# Patient Record
Sex: Male | Born: 1990 | Race: Black or African American | Hispanic: No | Marital: Single | Smoking: Current every day smoker
Health system: Southern US, Community
[De-identification: ages and names within clinical notes are randomized; demographics above are authoritative.]

## PROBLEM LIST (undated history)

## (undated) DIAGNOSIS — I10 Essential (primary) hypertension: Secondary | ICD-10-CM

## (undated) HISTORY — PX: ANTERIOR CRUCIATE LIGAMENT REPAIR: SHX115

---
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
## 2008-07-03 ENCOUNTER — Emergency Department (HOSPITAL_COMMUNITY): Admission: EM | Admit: 2008-07-03 | Discharge: 2008-07-03 | Payer: Self-pay | Admitting: Emergency Medicine

## 2008-08-07 ENCOUNTER — Ambulatory Visit (HOSPITAL_COMMUNITY): Admission: RE | Admit: 2008-08-07 | Discharge: 2008-08-07 | Payer: Self-pay | Admitting: Family Medicine

## 2008-08-10 ENCOUNTER — Ambulatory Visit: Payer: Self-pay | Admitting: Orthopedic Surgery

## 2008-08-10 DIAGNOSIS — M23302 Other meniscus derangements, unspecified lateral meniscus, unspecified knee: Secondary | ICD-10-CM

## 2008-08-10 DIAGNOSIS — S83509A Sprain of unspecified cruciate ligament of unspecified knee, initial encounter: Secondary | ICD-10-CM

## 2008-08-12 ENCOUNTER — Encounter: Payer: Self-pay | Admitting: Orthopedic Surgery

## 2008-08-12 ENCOUNTER — Telehealth: Payer: Self-pay | Admitting: Orthopedic Surgery

## 2008-08-18 ENCOUNTER — Encounter: Payer: Self-pay | Admitting: Orthopedic Surgery

## 2008-08-21 ENCOUNTER — Ambulatory Visit: Payer: Self-pay | Admitting: Orthopedic Surgery

## 2008-08-21 ENCOUNTER — Observation Stay (HOSPITAL_COMMUNITY): Admission: RE | Admit: 2008-08-21 | Discharge: 2008-08-22 | Payer: Self-pay | Admitting: Orthopedic Surgery

## 2008-08-22 ENCOUNTER — Encounter: Payer: Self-pay | Admitting: Orthopedic Surgery

## 2008-08-25 ENCOUNTER — Ambulatory Visit: Payer: Self-pay | Admitting: Orthopedic Surgery

## 2008-08-27 ENCOUNTER — Encounter (HOSPITAL_COMMUNITY): Admission: RE | Admit: 2008-08-27 | Discharge: 2008-09-26 | Payer: Self-pay | Admitting: Orthopedic Surgery

## 2008-08-27 ENCOUNTER — Encounter: Payer: Self-pay | Admitting: Orthopedic Surgery

## 2008-09-01 ENCOUNTER — Ambulatory Visit: Payer: Self-pay | Admitting: Orthopedic Surgery

## 2008-09-08 ENCOUNTER — Encounter: Payer: Self-pay | Admitting: Orthopedic Surgery

## 2008-09-25 ENCOUNTER — Encounter: Payer: Self-pay | Admitting: Orthopedic Surgery

## 2008-09-28 ENCOUNTER — Encounter (HOSPITAL_COMMUNITY): Admission: RE | Admit: 2008-09-28 | Discharge: 2008-10-28 | Payer: Self-pay | Admitting: Orthopedic Surgery

## 2008-09-30 ENCOUNTER — Ambulatory Visit: Payer: Self-pay | Admitting: Orthopedic Surgery

## 2008-10-14 ENCOUNTER — Encounter: Payer: Self-pay | Admitting: Orthopedic Surgery

## 2008-10-30 ENCOUNTER — Encounter (HOSPITAL_COMMUNITY): Admission: RE | Admit: 2008-10-30 | Discharge: 2008-11-29 | Payer: Self-pay | Admitting: Orthopedic Surgery

## 2008-11-11 ENCOUNTER — Ambulatory Visit: Payer: Self-pay | Admitting: Orthopedic Surgery

## 2008-11-25 ENCOUNTER — Encounter: Payer: Self-pay | Admitting: Orthopedic Surgery

## 2008-12-10 ENCOUNTER — Encounter: Payer: Self-pay | Admitting: Orthopedic Surgery

## 2011-04-11 NOTE — Op Note (Signed)
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  He came down wrong on his knee and felt a loud  pop, he had immediate swelling, and negative x-rays.  Clinical exam  revealed a possible ACL tear.  This was confirmed by MRI at [facility] on August 07, 2008.  He was seen in the office on August 10, 2008, was recommended that he have surgery to repair his torn ACL,  and informed consent was given in the office.   It was also noted that he had a medial meniscal tear on MRI.  Collateral  ligaments were said to be damaged on the MRI, but this was not found to  be clinically or under anesthesia today.   PREOPERATIVE DIAGNOSES:  Anterior cruciate ligament tear and medial  meniscal tear of the left knee.   POSTOPERATIVE DIAGNOSES:  Anterior cruciate ligament tear, left knee;  medial meniscal tear, left knee; and lateral meniscal tear, left knee.   SURGEON:  Vickki Hearing, MD   PROCEDURES:  1. Anterior cruciate ligament reconstruction with Achilles tendon      allograft.  2. Inside-out medial meniscal repair.  3. Partial lateral meniscectomy.   ASSISTANT:  Assisted by Margaree Mackintosh.   ANESTHESIA:  General.   FINDINGS:  Under anesthesia, he had a 2 to 3+ Lachman with a 0.2+ pivot  shift.  Collateral ligaments were stable at 0 and 30 degrees, and there  was no external rotation abnormality compared right to left.  This was  tested at 90 and 30 degrees.   PCL was intact.   The patient was marked on the left knee after proper identification,  countersigned by the  surgeon.  The patient was given antibiotics, taken  to surgery, had general anesthesia followed by exam under anesthesia.  Time-out was completed.   Leg was previously prepped with DuraPrep and draped sterilely.  We  injected the knee with 20 mL of Marcaine with epinephrine.  We made a  lateral portal.  We did a diagnostic arthroscopy.  ACL was found to be  completely shredded.  Medial meniscal had a peripheral rim tear.  Lateral meniscus had a white-white posterior horn tear.   The ACL was debrided.  The over-the-top position was identified.  A  small notchplasty was performed.   Tourniquet was elevated and posteromedial incision was made.  Subcutaneous tissue was divided.  Medial hamstrings were identified,  retracted posteriorly down, and dissection was carried down to the  capsule.  A speculum was placed around the posterolateral corner and  then a inside-out meniscal repair with 1 vertical mattress suture and 1  horizontal mattress suture.   The ACL  reconstruction was then completed.  We drilled a 11-mm tunnel on  the tibia in the ACL posterior footprint.  We then cleaned the tunnel,  rasped it posteriorly.  We inserted a 7-mm over-the-top guide at the 2  o'clock position.  We drilled the beef pin out the anterolateral thigh  and over reamed this with an 11-mm reamer.   The graft had been falling on the back table for appropriate time.  We  then fashioned the graft with 11-mm tunnel with the Achilles tendon.  We  over drilled the beef pin with a Endobutton cannulated drill.  We  measured our tunnel to be 50.  We prepared an Endobutton for 50-mm  tunnel length construct with a 35-mm bone plug tunnel to allow 10 mm of  turning radius.   The knee was irrigated and debrided.  Soft tissue was resected as  needed.  Graft was then passed.  The slit maneuver was performed.  The  graft construct was confirmed to be stable.  Knee was taken through a  range of motion.  Extension was  checked and it was full with no  impingement.   The knee was then placed in extension and a 11 x 35 screw was placed on  the tibial side with the knee in extension.  This was snug fit.  We did  rotate the graft laterally to increase strength of the graft.  Endobutton was a continuous loop of 35-mm button.  We used a [employer]  Bioabsorbable ACL Interference screw PLLA.   We placed the scope back in the knee and tested the ligament for  tension.  It was excellent.  Range of motion was full.  We then tied our  sutures on the posteromedial capsule, then probed the meniscus to  confirm repair was solid.   We then irrigated the knee, and closed the posteromedial incision with 0  Monocryl and staples.  We placed a pain pump catheter in this incision.   We closed the medial tunnel site with 0 Monocryl and staples as well.  The knee was then dressed sterilely.  Cryo/Cuff and Ace bandage placed  in a locked hinge brace.  The patient was extubated and taken to  recovery room in stable condition.   POSTOPERATIVE PLAN:  Because of the meniscal repair, the patient's rehab  protocol will be altered.  He will be 0-90 degrees for 6 weeks.  He can  weightbear in full extension in a brace for 6 weeks.      Vickki Hearing, M.D.  Electronically Signed     [facility]/MEDQ  D:  08/21/2008  T:  08/21/2008  Job:  [id number]

## 2011-04-11 NOTE — Consult Note (Signed)
NAME:  Thomas Heath, Thomas Heath               ACCOUNT NO.:  [id number]   MEDICAL RECORD NO.:  [id number]          PATIENT TYPE:  INP   LOCATION:  IC05                          FACILITY:  [facility]   PHYSICIAN:  Francoise Schaumann. Halm, DO, FAAPDATE OF BIRTH:  01-Apr-1991   DATE OF CONSULTATION:  08/22/2008  DATE OF DISCHARGE:  08/22/2008                                 CONSULTATION   PHYSICIAN REQUESTING CONSULTATION:  Vickki Hearing, MD   REASON FOR CONSULTATION:  Postoperative hypertension.   BRIEF HISTORY:  The patient is a regular patient of Dr. Gerda Diss, whom I  am covering for this weekend when I received the consultation.  He is an  20 year old black male that had surgery for a left ACL tear and medial  meniscus repair.  Postoperatively, the patient was noted to be  hypertensive in recovery and was admitted to the hospital and placed in  the ICU as a backup measure to monitor his heart rate and blood  pressure.  Postoperatively, his blood pressure was approximately  160/100.  He had no significant tachycardia and was otherwise stable.  I  suggested to Dr. Romeo Apple that without a history of asthma or  bradycardia that he initiate metoprolol 25 mg p.o. b.i.d. on the night  before my evaluation.   Overnight, the patient has done fine, but has had some mild persistent  hypertension in the 140-150 range systolic and 90-100 diastolic.  His  heart rate has been up into the 80s and 90s.  I have noticed he has had  some moderate pain and he is provided with appropriate analgesics.   PHYSICAL EXAMINATION:  GENERAL:  A well-nourished, well-developed,  muscular male in no distress.  He was cooperative, alert, and oriented.  NECK:  Thyroid was normal to palpation.  He had no JVD.  HEART:  Regular with no S4.  No ectopy.  LUNGS:  Clear in all fields.  ABDOMEN:  Soft and nontender.  EXTREMITIES:  He had his left leg and knee wrapped.  He had no obvious  edema noted of the right lower extremity.   IMPRESSION:  1. New-onset hypertension postoperatively.  2. Otherwise, stable medical status.   RECOMMENDATIONS:  I have suggested to Dr. Romeo Apple that the patient to  be continued on metoprolol for now at an increased dose since he  tolerated the 25 mg without any bradycardia or extreme response of his  blood pressure dropping.  I suggested that he gets 50 mg p.o. twice a  day, which I have ordered the day I saw him and that he continue this as  an outpatient until seen by Dr. Gerda Diss, his regular physician, within  the next week.      Francoise Schaumann. Milford Cage, DO, FAAP  Electronically Signed     [facility]/MEDQ  D:  08/24/2008  T:  08/25/2008  Job:  [id number]   cc:   Vickki Hearing, M.D.  Fax: [phone]   Donna Bernard, M.D.  Fax: [phone]

## 2011-08-28 LAB — BASIC METABOLIC PANEL
BUN: 12
CO2: 28
Calcium: 10
Chloride: 104
Creatinine, Ser: 1
GFR calc non Af Amer: >60
Glucose, Bld: 76
Potassium: 4.1
Sodium: 137

## 2011-08-28 LAB — HEMOGLOBIN AND HEMATOCRIT, BLOOD: Hemoglobin: 14.2

## 2012-01-07 ENCOUNTER — Emergency Department (HOSPITAL_COMMUNITY)
Admission: EM | Admit: 2012-01-07 | Discharge: 2012-01-07 | Disposition: A | Discharge status: Home / Self Care | Payer: BC Managed Care – PPO | Attending: Emergency Medicine | Admitting: Emergency Medicine

## 2012-01-07 ENCOUNTER — Encounter (HOSPITAL_COMMUNITY): Payer: Self-pay

## 2012-01-07 DIAGNOSIS — R059 Cough, unspecified: Secondary | ICD-10-CM | POA: Insufficient documentation

## 2012-01-07 DIAGNOSIS — R070 Pain in throat: Secondary | ICD-10-CM | POA: Insufficient documentation

## 2012-01-07 DIAGNOSIS — R6889 Other general symptoms and signs: Secondary | ICD-10-CM | POA: Insufficient documentation

## 2012-01-07 DIAGNOSIS — J45909 Unspecified asthma, uncomplicated: Secondary | ICD-10-CM | POA: Insufficient documentation

## 2012-01-07 DIAGNOSIS — R05 Cough: Secondary | ICD-10-CM

## 2012-01-07 DIAGNOSIS — R6883 Chills (without fever): Secondary | ICD-10-CM | POA: Insufficient documentation

## 2012-01-07 DIAGNOSIS — H9209 Otalgia, unspecified ear: Secondary | ICD-10-CM | POA: Insufficient documentation

## 2012-01-07 MED ORDER — GUAIFENESIN-CODEINE 100-10 MG/5ML PO SOLN
10.0000 mL | Freq: Once | ORAL | Status: AC
  Administered 2012-01-07: 10 mL via ORAL
  Filled 2012-01-07: qty 10

## 2012-01-07 MED ORDER — GUAIFENESIN-CODEINE 100-10 MG/5ML PO SYRP: 10.0000 mL | ORAL_SOLUTION | Freq: Three times a day (TID) | ORAL | Status: AC | PRN

## 2012-01-07 MED ORDER — ALBUTEROL SULFATE HFA 108 (90 BASE) MCG/ACT IN AERS
2.0000 | INHALATION_SPRAY | Freq: Once | RESPIRATORY_TRACT | Status: AC
  Administered 2012-01-07: 2 via RESPIRATORY_TRACT
  Filled 2012-01-07: qty 6.7

## 2012-01-07 MED ORDER — AZITHROMYCIN 250 MG PO TABS: ORAL_TABLET | ORAL | Status: DC

## 2012-01-07 NOTE — ED Notes (Signed)
Pt showed proper demonstration of inhaler as  Pt has a history of asthma. And prior use of said inhalers.

## 2012-01-07 NOTE — ED Provider Notes (Signed)
Medical screening examination/treatment/procedure(s) were performed by non-physician practitioner and as supervising physician I was immediately available for consultation/collaboration.   Loren Racer, MD 01/07/12 [phone]

## 2012-01-07 NOTE — ED Notes (Signed)
Pt presents with cough x 3 days. 

## 2012-01-07 NOTE — ED Provider Notes (Signed)
History     CSN: [id number]  Arrival date & time 01/07/12  2126   First MD Initiated Contact with Patient 01/07/12 2157      Chief Complaint  Patient presents with  . Cough    (Consider location/radiation/quality/duration/timing/severity/associated sxs/prior treatment) HPI Comments: Patient complains of productive cough for 3 days. He also reports some chills, sore throat and left ear pain.  He states he has a history of asthma and has been without his albuterol inhaler for several days.  He denies fever at home, vomiting, shortness of breath, chest pain or abdominal pain.    Patient is a 21 y.o. male presenting with cough. The history is provided by the patient. No language interpreter was used.  Cough This is a new problem. The current episode started more than 2 days ago. The problem occurs every few minutes. The problem has not changed since onset.The cough is productive of sputum. There has been no fever. Associated symptoms include chills, ear pain and sore throat. Pertinent negatives include no chest pain, no ear congestion, no headaches, no rhinorrhea, no myalgias, no shortness of breath and no wheezing. He has tried nothing for the symptoms. The treatment provided no relief. He is not a smoker. His past medical history is significant for asthma. His past medical history does not include pneumonia or COPD.    Past Medical History  Diagnosis Date  . Asthma     Past Surgical History  Procedure Date  . Anterior cruciate ligament repair     History reviewed. No pertinent family history.  History  Substance Use Topics  . Smoking status: Never Smoker   . Smokeless tobacco: Not on file  . Alcohol Use: No      Review of Systems  Constitutional: Positive for chills. Negative for fever, activity change and appetite change.  HENT: Positive for ear pain, sore throat and sneezing. Negative for facial swelling, rhinorrhea, trouble swallowing, neck pain and neck stiffness.     Respiratory: Positive for cough. Negative for chest tightness, shortness of breath and wheezing.   Cardiovascular: Negative for chest pain.  Gastrointestinal: Negative for nausea, vomiting, abdominal pain and diarrhea.  Musculoskeletal: Negative for myalgias and arthralgias.  Skin: Negative.   Neurological: Negative for dizziness and headaches.  Hematological: Negative for adenopathy. Does not bruise/bleed easily.  All other systems reviewed and are negative.    Allergies  Review of patient's allergies indicates no known allergies.  Home Medications  No current outpatient prescriptions on file.  BP 161/87  Pulse 90  Temp(Src) 98.2 F (36.8 C) (Oral)  Resp 22  Ht 5\' 6"  (1.676 m)  Wt 180 lb (81.647 kg)  BMI 29.05 kg/m2  SpO2 100%  Physical Exam  Nursing note and vitals reviewed. Constitutional: He is oriented to person, place, and time. He appears well-developed and well-nourished. No distress.  HENT:  Head: Normocephalic and atraumatic.  Right Ear: Tympanic membrane and ear canal normal. No mastoid tenderness. No hemotympanum.  Left Ear: Ear canal normal. No mastoid tenderness. Tympanic membrane is erythematous. No hemotympanum.  Mouth/Throat: Uvula is midline, oropharynx is clear and moist and mucous membranes are normal.  Neck: Normal range of motion. Neck supple. No thyromegaly present.  Cardiovascular: Normal rate, regular rhythm, normal heart sounds and intact distal pulses.   No murmur heard. Pulmonary/Chest: Effort normal. No respiratory distress. He has no wheezes. He has no rales.       Lungs sounds are coarse bilaterally  Musculoskeletal: He exhibits no edema.  Lymphadenopathy:    He has no cervical adenopathy.  Neurological: He is alert and oriented to person, place, and time. He exhibits normal muscle tone. Coordination normal.  Skin: Skin is warm and dry.    ED Course  Procedures (including critical care time)       MDM    Patient is alert, no  acute distress. Mucous membranes are moist. Vitals are stable, no tachycardia, tachypnea, or hypoxia. Lung sounds are coarse without wheezes or rales.    Patient / Family / Caregiver understand and agree with initial ED impression and plan with expectations set for ED visit. Pt stable in ED with no significant deterioration in condition.Pt feels improved after observation and/or treatment in ED.       Maxwell Lemen L. [place], [place] 01/07/12 2217

## 2012-01-07 NOTE — ED Notes (Signed)
Pt presents to ED secondary to cough, chest pain related to cough, sore throat and left ear pain.  Pt states his left knee also hurts, but is an old injury. Pt also reports chills/feverish feeling.

## 2012-07-09 ENCOUNTER — Encounter (HOSPITAL_COMMUNITY): Payer: Self-pay

## 2012-07-09 ENCOUNTER — Emergency Department (HOSPITAL_COMMUNITY)
Admission: EM | Admit: 2012-07-09 | Discharge: 2012-07-09 | Disposition: A | Discharge status: Home / Self Care | Payer: BC Managed Care – PPO | Attending: Emergency Medicine | Admitting: Emergency Medicine

## 2012-07-09 DIAGNOSIS — J45909 Unspecified asthma, uncomplicated: Secondary | ICD-10-CM | POA: Insufficient documentation

## 2012-07-09 DIAGNOSIS — K5289 Other specified noninfective gastroenteritis and colitis: Secondary | ICD-10-CM | POA: Insufficient documentation

## 2012-07-09 DIAGNOSIS — K529 Noninfective gastroenteritis and colitis, unspecified: Secondary | ICD-10-CM

## 2012-07-09 LAB — CBC WITH DIFFERENTIAL/PLATELET
Eosinophils Absolute: 0.4 10*3/uL (ref 0.0–0.7)
Eosinophils Relative: 5 % (ref 0–5)
Hemoglobin: 13.3 g/dL (ref 13.0–17.0)
Lymphocytes Relative: 42 % (ref 12–46)
Lymphs Abs: 3.3 10*3/uL (ref 0.7–4.0)
MCH: 31 pg (ref 26.0–34.0)
MCV: 91.6 fL (ref 78.0–100.0)
Monocytes Relative: 6 % (ref 3–12)
Platelets: 279 10*3/uL (ref 150–400)
RBC: 4.29 MIL/uL (ref 4.22–5.81)
WBC: 7.8 10*3/uL (ref 4.0–10.5)

## 2012-07-09 LAB — BASIC METABOLIC PANEL
BUN: 18 mg/dL (ref 6–23)
CO2: 24 mEq/L (ref 19–32)
Calcium: 9.7 mg/dL (ref 8.4–10.5)
Glucose, Bld: 82 mg/dL (ref 70–99)
Potassium: 3.8 mEq/L (ref 3.5–5.1)
Sodium: 140 mEq/L (ref 135–145)

## 2012-07-09 MED ORDER — SODIUM CHLORIDE 0.9 % IV BOLUS (SEPSIS): 1000.0000 mL | Freq: Once | INTRAVENOUS | Status: DC

## 2012-07-09 MED ORDER — PANTOPRAZOLE SODIUM 20 MG PO TBEC: 20.0000 mg | DELAYED_RELEASE_TABLET | Freq: Every day | ORAL | Status: DC

## 2012-07-09 MED ORDER — ONDANSETRON HCL 4 MG/2ML IJ SOLN
4.0000 mg | Freq: Once | INTRAMUSCULAR | Status: AC
Start: 2012-07-09 — End: 2012-07-09
  Administered 2012-07-09: 4 mg via INTRAVENOUS
  Filled 2012-07-09: qty 2

## 2012-07-09 MED ORDER — SODIUM CHLORIDE 0.9 % IV BOLUS (SEPSIS)
1000.0000 mL | Freq: Once | INTRAVENOUS | Status: AC
  Administered 2012-07-09: 1000 mL via INTRAVENOUS

## 2012-07-09 NOTE — ED Provider Notes (Cosign Needed)
History     CSN: [id number]  Arrival date & time 07/09/12  1221   First MD Initiated Contact with Patient 07/09/12 1300      Chief Complaint  Patient presents with  . Emesis  . Diarrhea  . Abdominal Pain    (Consider location/radiation/quality/duration/timing/severity/associated sxs/prior treatment) Patient is a 21 y.o. male presenting with vomiting, diarrhea, and abdominal pain. The history is provided by the patient (the pt complains of vomiting and diarhea since sat). No language interpreter was used.  Emesis  This is a new problem. The current episode started more than 2 days ago. The problem occurs 5 to 10 times per day. The problem has not changed since onset.The emesis has an appearance of stomach contents. The fever has been present for 1 to 2 days. Associated symptoms include abdominal pain and diarrhea. Pertinent negatives include no cough and no headaches. Risk factors: nothing.  Diarrhea The primary symptoms include abdominal pain, vomiting and diarrhea. Primary symptoms do not include fatigue or rash.  The illness does not include back pain.  Abdominal Pain The primary symptoms of the illness include abdominal pain, vomiting and diarrhea. The primary symptoms of the illness do not include fatigue.  Symptoms associated with the illness do not include hematuria, frequency or back pain.    Past Medical History  Diagnosis Date  . Asthma     Past Surgical History  Procedure Date  . Anterior cruciate ligament repair     No family history on file.  History  Substance Use Topics  . Smoking status: Never Smoker   . Smokeless tobacco: Not on file  . Alcohol Use: No      Review of Systems  Constitutional: Negative for fatigue.  HENT: Negative for congestion, sinus pressure and ear discharge.   Eyes: Negative for discharge.  Respiratory: Negative for cough.   Cardiovascular: Negative for chest pain.  Gastrointestinal: Positive for vomiting, abdominal pain and  diarrhea.  Genitourinary: Negative for frequency and hematuria.  Musculoskeletal: Negative for back pain.  Skin: Negative for rash.  Neurological: Negative for seizures and headaches.  Hematological: Negative.   Psychiatric/Behavioral: Negative for hallucinations.    Allergies  Review of patient's allergies indicates no known allergies.  Home Medications   Current Outpatient Rx  Name Route Sig Dispense Refill  . DIPHENHYDRAMINE HCL 25 MG PO TABS Oral Take 25 mg by mouth daily as needed. Allergies    . PANTOPRAZOLE SODIUM 20 MG PO TBEC Oral Take 1 tablet (20 mg total) by mouth daily. 20 tablet 1    BP 124/65  Pulse 57  Temp 98.3 F (36.8 C) (Oral)  Resp 20  Ht 5\' 6"  (1.676 m)  Wt 180 lb (81.647 kg)  BMI 29.05 kg/m2  SpO2 98%  Physical Exam  Constitutional: He is oriented to person, place, and time. He appears well-developed.  HENT:  Head: Normocephalic and atraumatic.  Eyes: Conjunctivae and EOM are normal. No scleral icterus.  Neck: Neck supple. No thyromegaly present.  Cardiovascular: Normal rate and regular rhythm.  Exam reveals no gallop and no friction rub.   No murmur heard. Pulmonary/Chest: No stridor. He has no wheezes. He has no rales. He exhibits no tenderness.  Abdominal: He exhibits no distension. There is tenderness. There is no rebound.       Mild epigastric tender  Musculoskeletal: Normal range of motion. He exhibits no edema.  Lymphadenopathy:    He has no cervical adenopathy.  Neurological: He is oriented to person, place, and  time. Coordination normal.  Skin: No rash noted. No erythema.  Psychiatric: He has a normal mood and affect. His behavior is normal.    ED Course  Procedures (including critical care time)  Labs Reviewed  BASIC METABOLIC PANEL - Abnormal; Notable for the following:    Creatinine, Ser 1.36 (*)     GFR calc non Af Amer 73 (*)     GFR calc Af Amer 84 (*)     All other components within normal limits  CBC WITH DIFFERENTIAL     No results found.   1. Gastroenteritis       MDM          Benny Lennert, MD 07/09/12 1515

## 2012-07-09 NOTE — ED Notes (Signed)
Pt states N/V/D started Saturday, vomiting stopped yesterday however continues to have diarrhea with stomach cramps/spasm. Pt denies fever.

## 2012-07-09 NOTE — ED Notes (Signed)
Pt c/o n/v/d and abd pain since Saturday.

## 2012-11-22 ENCOUNTER — Emergency Department (HOSPITAL_COMMUNITY)
Admission: EM | Admit: 2012-11-22 | Discharge: 2012-11-22 | Discharge status: Against Medical Advice | Payer: BC Managed Care – PPO | Attending: Emergency Medicine | Admitting: Emergency Medicine

## 2012-11-22 ENCOUNTER — Encounter (HOSPITAL_COMMUNITY): Payer: Self-pay | Admitting: *Deleted

## 2012-11-22 DIAGNOSIS — R197 Diarrhea, unspecified: Secondary | ICD-10-CM | POA: Insufficient documentation

## 2012-11-22 DIAGNOSIS — R1110 Vomiting, unspecified: Secondary | ICD-10-CM | POA: Insufficient documentation

## 2012-11-22 DIAGNOSIS — R109 Unspecified abdominal pain: Secondary | ICD-10-CM | POA: Insufficient documentation

## 2012-11-22 DIAGNOSIS — J45909 Unspecified asthma, uncomplicated: Secondary | ICD-10-CM | POA: Insufficient documentation

## 2012-11-22 LAB — CBC WITH DIFFERENTIAL/PLATELET
Basophils Relative: 1 % (ref 0–1)
Eosinophils Absolute: 0.1 10*3/uL (ref 0.0–0.7)
Eosinophils Relative: 2 % (ref 0–5)
Hemoglobin: 14.6 g/dL (ref 13.0–17.0)
Lymphs Abs: 3.1 10*3/uL (ref 0.7–4.0)
MCH: 31.7 pg (ref 26.0–34.0)
MCHC: 35.3 g/dL (ref 30.0–36.0)
MCV: 89.8 fL (ref 78.0–100.0)
Monocytes Relative: 7 % (ref 3–12)
RBC: 4.61 MIL/uL (ref 4.22–5.81)

## 2012-11-22 LAB — COMPREHENSIVE METABOLIC PANEL
BUN: 12 mg/dL (ref 6–23)
Calcium: 9.3 mg/dL (ref 8.4–10.5)
Creatinine, Ser: 1.1 mg/dL (ref 0.50–1.35)
GFR calc Af Amer: >90 mL/min (ref >90)
Glucose, Bld: 94 mg/dL (ref 70–99)
Total Protein: 7.9 g/dL (ref 6.0–8.3)

## 2012-11-22 NOTE — ED Notes (Signed)
Pt not in waiting room

## 2012-11-22 NOTE — ED Notes (Signed)
Pt states vomiting, diarrhea, and abdominal pain x 3 days. Unable to keep food down, able to keep liquids down. Last vomited at 0600. NAD.

## 2012-11-22 NOTE — ED Notes (Signed)
Unable to locate pt in waiting areas 

## 2014-01-07 ENCOUNTER — Encounter: Payer: Self-pay | Admitting: Family Medicine

## 2014-01-07 ENCOUNTER — Ambulatory Visit (INDEPENDENT_AMBULATORY_CARE_PROVIDER_SITE_OTHER): Payer: BC Managed Care – PPO | Admitting: Family Medicine

## 2014-01-07 VITALS — BP 130/80 | Ht 67.0 in | Wt 206.2 lb

## 2014-01-07 DIAGNOSIS — Z0000 Encounter for general adult medical examination without abnormal findings: Secondary | ICD-10-CM

## 2014-01-07 MED ORDER — ALBUTEROL SULFATE HFA 108 (90 BASE) MCG/ACT IN AERS: 2.0000 | INHALATION_SPRAY | Freq: Four times a day (QID) | RESPIRATORY_TRACT | Status: DC | PRN

## 2014-01-07 NOTE — Progress Notes (Signed)
   Subjective:    Patient ID: Thomas Heath, male    DOB: 03/31/1991, 23 y.o.   MRN: [id number]  HPI Patient arrives for an annual physical.   Aches every now and then in the knee, history of meniscal injury in the knee.  No hx of blood work  B ball in the past, Patient would also like a tick bite on his back checked. No fever no rash elsewhere no headache no myalgias.  Not exercising  Smokes several cigarettes per day.  Use to exercise regularly.  Review of Systems  Constitutional: Negative for fever, activity change and appetite change.  HENT: Negative for congestion and rhinorrhea.   Eyes: Negative for discharge.  Respiratory: Negative for cough and wheezing.   Cardiovascular: Negative for chest pain.  Gastrointestinal: Negative for vomiting, abdominal pain and blood in stool.  Genitourinary: Negative for frequency and difficulty urinating.  Musculoskeletal: Negative for neck pain.  Skin: Negative for rash.  Allergic/Immunologic: Negative for environmental allergies and food allergies.  Neurological: Negative for weakness and headaches.  Psychiatric/Behavioral: Negative for agitation.  All other systems reviewed and are negative.       Objective:   Physical Exam  Vitals reviewed. Constitutional: He appears well-developed and well-nourished.  HENT:  Head: Normocephalic and atraumatic.  Right Ear: External ear normal.  Left Ear: External ear normal.  Nose: Nose normal.  Mouth/Throat: Oropharynx is clear and moist.  Eyes: EOM are normal. Pupils are equal, round, and reactive to light.  Neck: Normal range of motion. Neck supple. No thyromegaly present.  Cardiovascular: Normal rate, regular rhythm and normal heart sounds.   No murmur heard. Pulmonary/Chest: Effort normal and breath sounds normal. No respiratory distress. He has no wheezes.  Abdominal: Soft. Bowel sounds are normal. He exhibits no distension and no mass. There is no tenderness.  Genitourinary:  Penis normal.  Musculoskeletal: Normal range of motion. He exhibits no edema.  Lymphadenopathy:    He has no cervical adenopathy.  Neurological: He is alert. He exhibits normal muscle tone.  Skin: Skin is warm and dry. No erythema.  Psychiatric: He has a normal mood and affect. His behavior is normal. Judgment normal.          Assessment & Plan:  Impression #1 wellness exam. #2 history of reactive airways clinically stable at this time. Ask up on occasion. #3 tick bite with local reaction discussed plan check appropriate blood work. Encouraged to stop smoking. Diet exercise discussed in encourage. WSL

## 2014-01-08 LAB — LIPID PANEL
Cholesterol: 149 mg/dL (ref 0–200)
HDL: 45 mg/dL (ref >39)
LDL CALC: 85 mg/dL (ref 0–99)
Total CHOL/HDL Ratio: 3.3 Ratio
Triglycerides: 96 mg/dL (ref <150)
VLDL: 19 mg/dL (ref 0–40)

## 2014-01-08 LAB — GLUCOSE, RANDOM: GLUCOSE: 85 mg/dL (ref 70–99)

## 2014-01-14 ENCOUNTER — Encounter: Payer: Self-pay | Admitting: Family Medicine

## 2014-05-19 ENCOUNTER — Encounter: Payer: Self-pay | Admitting: Family Medicine

## 2014-05-19 ENCOUNTER — Ambulatory Visit (INDEPENDENT_AMBULATORY_CARE_PROVIDER_SITE_OTHER): Payer: BC Managed Care – PPO | Admitting: Family Medicine

## 2014-05-19 VITALS — BP 132/88 | Ht 66.0 in | Wt 203.0 lb

## 2014-05-19 DIAGNOSIS — M25569 Pain in unspecified knee: Secondary | ICD-10-CM

## 2014-05-19 DIAGNOSIS — R109 Unspecified abdominal pain: Secondary | ICD-10-CM

## 2014-05-19 DIAGNOSIS — M25562 Pain in left knee: Secondary | ICD-10-CM

## 2014-05-19 MED ORDER — ETODOLAC 400 MG PO TABS: 400.0000 mg | ORAL_TABLET | Freq: Two times a day (BID) | ORAL | Status: DC

## 2014-05-19 MED ORDER — HYDROCODONE-ACETAMINOPHEN 5-325 MG PO TABS: 1.0000 | ORAL_TABLET | Freq: Four times a day (QID) | ORAL | Status: DC | PRN
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
## 2014-07-14 ENCOUNTER — Emergency Department (HOSPITAL_COMMUNITY)
Admission: EM | Admit: 2014-07-14 | Discharge: 2014-07-14 | Disposition: A | Discharge status: Home / Self Care | Payer: BC Managed Care – PPO | Attending: Emergency Medicine | Admitting: Emergency Medicine

## 2014-07-14 ENCOUNTER — Emergency Department (HOSPITAL_COMMUNITY): Payer: BC Managed Care – PPO

## 2014-07-14 ENCOUNTER — Encounter (HOSPITAL_COMMUNITY): Payer: Self-pay | Admitting: Emergency Medicine

## 2014-07-14 DIAGNOSIS — S59909A Unspecified injury of unspecified elbow, initial encounter: Secondary | ICD-10-CM | POA: Diagnosis present

## 2014-07-14 DIAGNOSIS — Y9389 Activity, other specified: Secondary | ICD-10-CM | POA: Insufficient documentation

## 2014-07-14 DIAGNOSIS — S59919A Unspecified injury of unspecified forearm, initial encounter: Secondary | ICD-10-CM

## 2014-07-14 DIAGNOSIS — Y9289 Other specified places as the place of occurrence of the external cause: Secondary | ICD-10-CM | POA: Insufficient documentation

## 2014-07-14 DIAGNOSIS — S51009A Unspecified open wound of unspecified elbow, initial encounter: Secondary | ICD-10-CM | POA: Insufficient documentation

## 2014-07-14 DIAGNOSIS — J45909 Unspecified asthma, uncomplicated: Secondary | ICD-10-CM | POA: Insufficient documentation

## 2014-07-14 DIAGNOSIS — S51031A Puncture wound without foreign body of right elbow, initial encounter: Secondary | ICD-10-CM

## 2014-07-14 DIAGNOSIS — IMO0002 Reserved for concepts with insufficient information to code with codable children: Secondary | ICD-10-CM | POA: Diagnosis not present

## 2014-07-14 DIAGNOSIS — Z23 Encounter for immunization: Secondary | ICD-10-CM | POA: Diagnosis not present

## 2014-07-14 DIAGNOSIS — S6990XA Unspecified injury of unspecified wrist, hand and finger(s), initial encounter: Secondary | ICD-10-CM

## 2014-07-14 MED ORDER — CEPHALEXIN 500 MG PO CAPS
500.0000 mg | ORAL_CAPSULE | Freq: Once | ORAL | Status: AC
  Administered 2014-07-14: 500 mg via ORAL
  Filled 2014-07-14: qty 1

## 2014-07-14 MED ORDER — SULFAMETHOXAZOLE-TRIMETHOPRIM 800-160 MG PO TABS: 1.0000 | ORAL_TABLET | Freq: Two times a day (BID) | ORAL | Status: DC

## 2014-07-14 MED ORDER — TETANUS-DIPHTH-ACELL PERTUSSIS 5-2.5-18.5 LF-MCG/0.5 IM SUSP
0.5000 mL | Freq: Once | INTRAMUSCULAR | Status: AC
  Administered 2014-07-14: 0.5 mL via INTRAMUSCULAR
  Filled 2014-07-14: qty 0.5

## 2014-07-14 MED ORDER — SULFAMETHOXAZOLE-TMP DS 800-160 MG PO TABS
1.0000 | ORAL_TABLET | Freq: Once | ORAL | Status: AC
  Administered 2014-07-14: 1 via ORAL
  Filled 2014-07-14: qty 1

## 2014-07-14 MED ORDER — HYDROCODONE-ACETAMINOPHEN 5-325 MG PO TABS
1.0000 | ORAL_TABLET | Freq: Once | ORAL | Status: AC
  Administered 2014-07-14: 1 via ORAL
  Filled 2014-07-14: qty 1

## 2014-07-14 MED ORDER — CEPHALEXIN 500 MG PO CAPS: 500.0000 mg | ORAL_CAPSULE | Freq: Four times a day (QID) | ORAL | Status: DC

## 2014-07-14 MED ORDER — HYDROCODONE-ACETAMINOPHEN 5-325 MG PO TABS: ORAL_TABLET | ORAL | Status: DC

## 2014-07-14 NOTE — Discharge Instructions (Signed)
Puncture Wound °A puncture wound is an injury that extends through all layers of the skin and into the tissue beneath the skin (subcutaneous tissue). Puncture wounds become infected easily because germs often enter the body and go beneath the skin during the injury. Having a deep wound with a small entrance point makes it difficult for your caregiver to adequately clean the wound. This is especially true if you have stepped on a nail and it has passed through a dirty shoe or other situations where the wound is obviously contaminated. °CAUSES  °Many puncture wounds involve glass, nails, splinters, fish hooks, or other objects that enter the skin (foreign bodies). A puncture wound may also be caused by a human bite or animal bite. °DIAGNOSIS  °A puncture wound is usually diagnosed by your history and a physical exam. You may need to have an X-ray or an ultrasound to check for any foreign bodies still in the wound. °TREATMENT  °· Your caregiver will clean the wound as thoroughly as possible. Depending on the location of the wound, a bandage (dressing) may be applied. °· Your caregiver might prescribe antibiotic medicines. °· You may need a follow-up visit to check on your wound. Follow all instructions as directed by your caregiver. °HOME CARE INSTRUCTIONS  °· Change your dressing once per day, or as directed by your caregiver. If the dressing sticks, it may be removed by soaking the area in water. °· If your caregiver has given you follow-up instructions, it is very important that you return for a follow-up appointment. Not following up as directed could result in a chronic or permanent injury, pain, and disability. °· Only take over-the-counter or prescription medicines for pain, discomfort, or fever as directed by your caregiver. °· If you are given antibiotics, take them as directed. Finish them even if you start to feel better. °You may need a tetanus shot if: °· You cannot remember when you had your last tetanus  shot. °· You have never had a tetanus shot. °If you got a tetanus shot, your arm may swell, get red, and feel warm to the touch. This is common and not a problem. If you need a tetanus shot and you choose not to have one, there is a rare chance of getting tetanus. Sickness from tetanus can be serious. °You may need a rabies shot if an animal bite caused your puncture wound. °SEEK MEDICAL CARE IF:  °· You have redness, swelling, or increasing pain in the wound. °· You have red streaks going away from the wound. °· You notice a bad smell coming from the wound or dressing. °· You have yellowish-white fluid (pus) coming from the wound. °· You are treated with an antibiotic for infection, but the infection is not getting better. °· You notice something in the wound, such as rubber from your shoe, cloth, or another object. °· You have a fever. °· You have severe pain. °· You have difficulty breathing. °· You feel dizzy or faint. °· You cannot stop vomiting. °· You lose feeling, develop numbness, or cannot move a limb below the wound. °· Your symptoms worsen. °MAKE SURE YOU: °· Understand these instructions. °· Will watch your condition. °· Will get help right away if you are not doing well or get worse. °Document Released: 08/23/2005 Document Revised: 02/05/2012 Document Reviewed: 05/02/2011 °ExitCare® Patient Information ©2015 [employer]. This information is not intended to replace advice given to you by your health care provider. Make sure you discuss any questions you   have with your health care provider. ° °

## 2014-07-14 NOTE — ED Provider Notes (Signed)
CSN: [id number]     Arrival date & time 07/14/14  1345 History   First MD Initiated Contact with Patient 07/14/14 1514     Chief Complaint  Patient presents with  . Arm Pain     (Consider location/radiation/quality/duration/timing/severity/associated sxs/prior Treatment) HPI  Thomas Heath is a 23 y.o. male who presents to the Emergency Department complaining of pain to the right elbow for 4 days.  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  He reports slight bleeding at the time of the injury, cleaned the site and has continued to work daily since the injury.  He reports having full ROM of the elbow but pain with full extension and flexion.  He denies redness, drainage, fever, chills, numbness or weakness of the arm.  Pain improves with ibuprofen.    Past Medical History  Diagnosis Date  . Asthma    Past Surgical History  Procedure Laterality Date  . Anterior cruciate ligament repair     No family history on file. History  Substance Use Topics  . Smoking status: Never Smoker   . Smokeless tobacco: Not on file  . Alcohol Use: No    Review of Systems  Constitutional: Negative for fever and chills.  Genitourinary: Negative for dysuria and difficulty urinating.  Musculoskeletal: Positive for arthralgias. Negative for joint swelling.  Skin: Positive for wound. Negative for color change.       Puncture wound right elbow  All other systems reviewed and are negative.     Allergies  Review of patient's allergies indicates no known allergies.  Home Medications   Prior to Admission medications   Medication Sig Start Date End Date Taking? Authorizing Provider  ibuprofen (ADVIL,MOTRIN) 200 MG tablet Take 200 mg by mouth every 6 (six) hours as needed for mild pain or moderate pain.   Yes Historical Provider, MD   BP 152/85  Temp(Src) 98.6 F (37 C) (Oral)  Resp 16  Wt 195 lb 3 oz (88.536 kg)  SpO2  98% Physical Exam  Nursing note and vitals reviewed. Constitutional: He is oriented to person, place, and time. He appears well-developed and well-nourished. No distress.  HENT:  Head: Normocephalic and atraumatic.  Cardiovascular: Normal rate, regular rhythm and normal heart sounds.   Pulmonary/Chest: Effort normal and breath sounds normal.  Musculoskeletal: He exhibits tenderness. He exhibits no edema.  tp of the lateral right elbow. small scabbed puncture wound.  No surrounding edema, erythema or excessive warmth of the joint.   Radial pulse is brisk, distal sensation intact.  CR< 2 sec.  No bruising or bony deformity.  Patient has full ROM. Compartments of the right arm are soft.  Neurological: He is alert and oriented to person, place, and time. He exhibits normal muscle tone. Coordination normal.  Skin: Skin is warm and dry.    ED Course  Procedures (including critical care time) Labs Review Labs Reviewed - No data to display  Imaging Review Dg Elbow Complete Right  07/14/2014   CLINICAL DATA:  ARM PAIN  EXAM: RIGHT ELBOW - COMPLETE 3+ VIEW  COMPARISON:  None.  FINDINGS: There is no evidence of fracture, dislocation, or joint effusion. There is no evidence of arthropathy or other focal bone abnormality. Soft tissues are unremarkable. No radiodense foreign body.  IMPRESSION: Negative.   Electronically Signed   By: Oley Balm M.D.   On: 07/14/2014 14:33     EKG Interpretation  None      MDM   Final diagnoses:  Puncture wound of elbow, right, initial encounter    Patient with healing puncture wound to the lateral right elbow. No effusion. No surrounding area edema or drainage. Neurovascularly intact. Patient has full range of motion of the joint.  No obvious cellulitis or abscess at this time, but mechanism of injury is concerning for possible early infection  Patient agrees to plan which includes tetanus vaccination, warm compresses, Keflex and Bactrim and close recheck  in 1-2 days.     Jannel Lynne L. Trisha Mangleriplett, PA-C 07/15/14 2138

## 2014-07-14 NOTE — ED Notes (Signed)
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

## 2014-07-16 NOTE — ED Provider Notes (Signed)
Medical screening examination/treatment/procedure(s) were performed by non-physician practitioner and as supervising physician I was immediately available for consultation/collaboration.  Flint MelterElliott L Kasem Mozer, MD 07/16/14 [phone]

## 2014-09-14 ENCOUNTER — Emergency Department (HOSPITAL_COMMUNITY)
Admission: EM | Admit: 2014-09-14 | Discharge: 2014-09-14 | Disposition: A | Discharge status: Home / Self Care | Payer: BC Managed Care – PPO | Attending: Emergency Medicine | Admitting: Emergency Medicine

## 2014-09-14 ENCOUNTER — Encounter (HOSPITAL_COMMUNITY): Payer: Self-pay | Admitting: Emergency Medicine

## 2014-09-14 ENCOUNTER — Emergency Department (HOSPITAL_COMMUNITY): Payer: BC Managed Care – PPO

## 2014-09-14 DIAGNOSIS — M25562 Pain in left knee: Secondary | ICD-10-CM | POA: Insufficient documentation

## 2014-09-14 DIAGNOSIS — M25462 Effusion, left knee: Secondary | ICD-10-CM

## 2014-09-14 DIAGNOSIS — Z792 Long term (current) use of antibiotics: Secondary | ICD-10-CM | POA: Insufficient documentation

## 2014-09-14 DIAGNOSIS — J45909 Unspecified asthma, uncomplicated: Secondary | ICD-10-CM | POA: Insufficient documentation

## 2014-09-14 MED ORDER — HYDROCODONE-ACETAMINOPHEN 5-325 MG PO TABS: ORAL_TABLET | ORAL | Status: DC

## 2014-09-14 MED ORDER — HYDROCODONE-ACETAMINOPHEN 5-325 MG PO TABS
2.0000 | ORAL_TABLET | Freq: Once | ORAL | Status: AC
  Administered 2014-09-14: 2 via ORAL
  Filled 2014-09-14: qty 2

## 2014-09-14 MED ORDER — CELECOXIB 100 MG PO CAPS: 100.0000 mg | ORAL_CAPSULE | Freq: Two times a day (BID) | ORAL | Status: DC

## 2014-09-14 MED ORDER — KETOROLAC TROMETHAMINE 10 MG PO TABS
10.0000 mg | ORAL_TABLET | Freq: Once | ORAL | Status: AC
  Administered 2014-09-14: 10 mg via ORAL
  Filled 2014-09-14: qty 1

## 2014-09-14 NOTE — ED Notes (Signed)
Left knee pain injured playing basketball yesterday

## 2014-09-14 NOTE — ED Provider Notes (Signed)
CSN: [id number]     Arrival date & time 09/14/14  1232 History  This chart was scribed for non-physician practitioner Ivery QualeHobson Hollyanne Schloesser, PA-C, working with Ward GivensIva L Knapp, MD by Littie Deedsichard Sun, ED Scribe. This patient was seen in room APFT24/APFT24 and the patient's care was started at 2:48 PM.    Chief Complaint  Patient presents with  . Knee Pain      The history is provided by the patient. No language interpreter was used.   HPI Comments: Thomas Heath is a 23 y.o. male who presents to the Emergency Department complaining of sudden onset, constant left knee pain with swelling that began after an injury while the patient was playing basketball yesterday. Patient states he came down on his knee wrong and heard a loud pop. Patient has tried Tylenol for his pain. He states he is able to ambulate, but is not able to put as much pressure on it. Patient had an ACL repair 4-5 years ago done by Dr. Romeo AppleHarrison.   Past Medical History  Diagnosis Date  . Asthma    Past Surgical History  Procedure Laterality Date  . Anterior cruciate ligament repair     No family history on file. History  Substance Use Topics  . Smoking status: Never Smoker   . Smokeless tobacco: Not on file  . Alcohol Use: No    Review of Systems  Constitutional: Negative for fever.  Musculoskeletal: Positive for arthralgias and joint swelling.  All other systems reviewed and are negative.     Allergies  Review of patient's allergies indicates no known allergies.  Home Medications   Prior to Admission medications   Medication Sig Start Date End Date Taking? Authorizing Provider  cephALEXin (KEFLEX) 500 MG capsule Take 1 capsule (500 mg total) by mouth 4 (four) times daily. For 7 days 07/14/14   Tammy L. Triplett, PA-C  HYDROcodone-acetaminophen (NORCO/VICODIN) 5-325 MG per tablet Take one-two tabs po q 4-6 hrs prn pain 07/14/14   Tammy L. Triplett, PA-C  ibuprofen (ADVIL,MOTRIN) 200 MG tablet Take 200 mg by mouth every 6  (six) hours as needed for mild pain or moderate pain.    Historical Provider, MD  sulfamethoxazole-trimethoprim (SEPTRA DS) 800-160 MG per tablet Take 1 tablet by mouth 2 (two) times daily. For 7 days 07/14/14   Tammy L. Triplett, PA-C   BP 133/77  Pulse 84  Temp(Src) 98.8 F (37.1 C) (Oral)  Resp 18  Ht 5\' 8"  (1.727 m)  SpO2 100% Physical Exam  Nursing note and vitals reviewed. Constitutional: He is oriented to person, place, and time. He appears well-developed and well-nourished. No distress.  HENT:  Head: Normocephalic and atraumatic.  Mouth/Throat: Oropharynx is clear and moist. No oropharyngeal exudate.  Eyes: Pupils are equal, round, and reactive to light.  Neck: Neck supple.  Cardiovascular: Normal rate.   Pulmonary/Chest: Effort normal.  Musculoskeletal: He exhibits edema.  No deformity of the quadriceps. Effusion of the left knee. Left knee is warm, but not hot. No deformities of the anterior fibulal tuberosity. No calf tenderness on the left. Achilles tendon intact. DP and PT pulse 2+. Mild laxity of the lateral joint.  Neurological: He is alert and oriented to person, place, and time. No cranial nerve deficit.  Skin: Skin is warm and dry. No rash noted.  Psychiatric: He has a normal mood and affect. His behavior is normal.    ED Course  Procedures  DIAGNOSTIC STUDIES: Oxygen Saturation is 100% on RA, nml by my  interpretation.    COORDINATION OF CARE: 2:57 PM-Discussed treatment plan which includes anti-inflammatory medication, knee immobilizer and ice and elevation with pt at bedside and pt agreed to plan.   Labs Review Labs Reviewed - No data to display  Imaging Review Dg Knee Complete 4 Views Left  09/14/2014   CLINICAL DATA:  Knee pain. Injury while playing basketball. History of ACL repair 2 years ago.  EXAM: LEFT KNEE - COMPLETE 4+ VIEW  COMPARISON:  MRI of the left knee 08/07/2008.  FINDINGS: The knee is located. The patient is status post ACL repair. A large  joint effusion is present. No acute osseous abnormality is evident.  IMPRESSION: 1. Status post ACL repair. 2. Large joint effusion without evidence for acute osseous abnormality.   Electronically Signed   By: Gennette Pachris  Mattern M.D.   On: 09/14/2014 13:36     EKG Interpretation None      MDM   X-ray he knee is negative for fracture or dislocation, however does reveal a large effusion. The patient is uncomfortable to allow complete evaluation of the left knee. Paragraph patient is fitted with a knee immobilizer and crutches. Prescription for Celebrex and Norco given to the patient. The patient will follow up with Dr. Romeo AppleHarrison in the office.    Final diagnoses:  Knee effusion, left  Knee pain, left    *I have reviewed nursing notes, vital signs, and all appropriate lab and imaging results for this patient.*  **I have reviewed nursing notes, vital signs, and all appropriate lab and imaging results for this patient.Kathie Dike*   Parks Czajkowski M Keandrea Tapley, PA-C 09/15/14 1149

## 2014-09-14 NOTE — Discharge Instructions (Signed)
Your x-ray reveals a large effusion/fluid in the joint. Please see Dr. Romeo AppleHarrison as soon as possible concerning your knee. Please use the knee immobilizer when up and about. Please use the crutches until you can safely apply weight to your leg. Please use Celebrex 2 times daily with food. Norco may cause drowsiness, please use with caution. Knee Effusion The medical term for having fluid in your knee is effusion. This is often due to an internal derangement of the knee. This means something is wrong inside the knee. Some of the causes of fluid in the knee may be torn cartilage, a torn ligament, or bleeding into the joint from an injury. Your knee is likely more difficult to bend and move. This is often because there is increased pain and pressure in the joint. The time it takes for recovery from a knee effusion depends on different factors, including:   Type of injury.  Your age.  Physical and medical conditions.  Rehabilitation Strategies. How long you will be away from your normal activities will depend on what kind of knee problem you have and how much damage is present. Your knee has two types of cartilage. Articular cartilage covers the bone ends and lets your knee bend and move smoothly. Two menisci, thick pads of cartilage that form a rim inside the joint, help absorb shock and stabilize your knee. Ligaments bind the bones together and support your knee joint. Muscles move the joint, help support your knee, and take stress off the joint itself. CAUSES  Often an effusion in the knee is caused by an injury to one of the menisci. This is often a tear in the cartilage. Recovery after a meniscus injury depends on how much meniscus is damaged and whether you have damaged other knee tissue. Small tears may heal on their own with conservative treatment. Conservative means rest, limited weight bearing activity and muscle strengthening exercises. Your recovery may take up to 6 weeks.  TREATMENT  Larger  tears may require surgery. Meniscus injuries may be treated during arthroscopy. Arthroscopy is a procedure in which your surgeon uses a small telescope like instrument to look in your knee. Your caregiver can make a more accurate diagnosis (learning what is wrong) by performing an arthroscopic procedure. If your injury is on the inner margin of the meniscus, your surgeon may trim the meniscus back to a smooth rim. In other cases your surgeon will try to repair a damaged meniscus with stitches (sutures). This may make rehabilitation take longer, but may provide better long term result by helping your knee keep its shock absorption capabilities. Ligaments which are completely torn usually require surgery for repair. HOME CARE INSTRUCTIONS  Use crutches as instructed.  If a brace is applied, use as directed.  Once you are home, an ice pack applied to your swollen knee may help with discomfort and help decrease swelling.  Keep your knee raised (elevated) when you are not up and around or on crutches.  Only take over-the-counter or prescription medicines for pain, discomfort, or fever as directed by your caregiver.  Your caregivers will help with instructions for rehabilitation of your knee. This often includes strengthening exercises.  You may resume a normal diet and activities as directed. SEEK MEDICAL CARE IF:   There is increased swelling in your knee.  You notice redness, swelling, or increasing pain in your knee.  An unexplained oral temperature above 102 F (38.9 C) develops. SEEK IMMEDIATE MEDICAL CARE IF:   You develop a  rash.  You have difficulty breathing.  You have any allergic reactions from medications you may have been given.  There is severe pain with any motion of the knee. MAKE SURE YOU:   Understand these instructions.  Will watch your condition.  Will get help right away if you are not doing well or get worse. Document Released: 02/03/2004 Document Revised:  02/05/2012 Document Reviewed: 04/08/2008 [facility] Patient Information 2015 [employer], [place]. This information is not intended to replace advice given to you by your health care provider. Make sure you discuss any questions you have with your health care provider.

## 2014-09-15 NOTE — ED Provider Notes (Signed)
Medical screening examination/treatment/procedure(s) were performed by non-physician practitioner and as supervising physician I was immediately available for consultation/collaboration.   EKG Interpretation None      Devoria AlbeIva Montreal Steidle, MD, Armando GangFACEP   Ward GivensIva L Daina Cara, MD 09/15/14 [phone]

## 2014-09-24 ENCOUNTER — Ambulatory Visit (INDEPENDENT_AMBULATORY_CARE_PROVIDER_SITE_OTHER): Payer: BC Managed Care – PPO | Admitting: Orthopedic Surgery

## 2014-09-24 ENCOUNTER — Encounter: Payer: Self-pay | Admitting: Orthopedic Surgery

## 2014-09-24 VITALS — BP 158/83 | Ht 68.0 in | Wt 195.0 lb

## 2014-09-24 DIAGNOSIS — S83512A Sprain of anterior cruciate ligament of left knee, initial encounter: Secondary | ICD-10-CM

## 2014-09-24 DIAGNOSIS — S83519A Sprain of anterior cruciate ligament of unspecified knee, initial encounter: Secondary | ICD-10-CM | POA: Insufficient documentation

## 2014-09-24 NOTE — Patient Instructions (Addendum)
Out of work 09/13/14-10/14/14 We will schedule MRI for you

## 2014-09-24 NOTE — Progress Notes (Signed)
Patient ID: Thomas Heath, male   DOB: 10/22/1991, 23 y.o.   MRN: [id number] Chief Complaint  Patient presents with  . Knee Injury    Left knee injury, DOI 09/13/14        Thomas Pedlbert W Ackert is a 23 y.o. male who presents with a knee injury involving the left knee. Onset was sudden, related to Basketball. Mechanism of injury: Landed awkwardly felt loud pop, immediate swelling and difficulty weightbearing. Current symptoms include: giving out, popping sensation, stiffness and swelling. Pain is aggravated by any weight bearing. Patient has had prior knee problems. About 5 years ago we did an anterior cruciate ligament with an Endobutton and medial meniscal repair open technique. He recovered well.  Evaluation to date: plain films: Endobutton noted on x-ray tunnels appear to be anatomic. Treatment to date: avoidance of offending activity, OTC analgesics which are not very effective, prescription NSAIDS which are not very effective and Hydrocodone.  Past Medical History  Diagnosis Date  . Asthma     Past Surgical History  Procedure Laterality Date  . Anterior cruciate ligament repair      History reviewed. No pertinent family history.  Social History History  Substance Use Topics  . Smoking status: Never Smoker   . Smokeless tobacco: Not on file  . Alcohol Use: No    No Known Allergies  Current Outpatient Prescriptions  Medication Sig Dispense Refill  . celecoxib (CELEBREX) 100 MG capsule Take 1 capsule (100 mg total) by mouth 2 (two) times daily.  14 capsule  0  . HYDROcodone-acetaminophen (NORCO/VICODIN) 5-325 MG per tablet 1 or 2 po q4h prn pain  20 tablet  0  . acetaminophen (TYLENOL) 500 MG tablet Take 500 mg by mouth every 6 (six) hours as needed for mild pain or moderate pain.       No current facility-administered medications for this visit.      Review of Systems A comprehensive review of systems was negative except for: Sinusitis, cough, breathing issues wheezing, and  related musculoskeletal complaints as noted in the history of present illness   VS BP 158/83  Ht 5\' 8"  (1.727 m)  Wt 195 lb (88.451 kg)  BMI 29.66 kg/m2   APPEARANCE normal muscular   ORIENTATION person place and time normal   MOOD normal   GAIT slightly antalgic left knee    left Knee   Swelling: Mild  Effusion: 3+  Tenderness: Medial joint line and Lateral joint line     Range of Motion:     Extension: -5    Flexion: 100     McMurrays: Negative  Anterior Lachmann: 2+  Posterior Lachmann: Negative  Anterior Drawer: Positive  Posterior Drawer: Negative  Varus Stress Test: Negative  Valgus Stress Test: Negative  Pivot Shift:  could not assess   Patellar Apprehension: No  Pulses: 2+ and normal both legs Sensation normal both legs DTR normal both legs  LOWER EXTREMITY LYMPH NODES negative both groins  Balance normal  The opposite knee: Normal range of motion, stability, strength. Skin normal.  IMAGING my interpretation of the x-ray normal for acute findings other than effusion old anterior cruciate ligament tunnels using the transtibial technique with Endobutton and place bone plugs in good position  DX: Anterior cruciate ligament recurrent tear  PLAN: MRI scan, patient would like to have surgery if ligament is torn  Out of work 4 weeks continue ibuprofen and hydrocodone

## 2014-09-30 ENCOUNTER — Ambulatory Visit (HOSPITAL_COMMUNITY): Payer: BC Managed Care – PPO

## 2014-10-05 ENCOUNTER — Ambulatory Visit (HOSPITAL_COMMUNITY)
Admission: RE | Admit: 2014-10-05 | Discharge: 2014-10-05 | Disposition: A | Discharge status: Home / Self Care | Payer: BC Managed Care – PPO | Source: Ambulatory Visit | Attending: Orthopedic Surgery | Admitting: Orthopedic Surgery

## 2014-10-05 DIAGNOSIS — S83512A Sprain of anterior cruciate ligament of left knee, initial encounter: Secondary | ICD-10-CM | POA: Diagnosis not present

## 2014-10-05 DIAGNOSIS — M25462 Effusion, left knee: Secondary | ICD-10-CM | POA: Insufficient documentation

## 2014-10-05 DIAGNOSIS — M25562 Pain in left knee: Secondary | ICD-10-CM | POA: Diagnosis present

## 2014-10-05 DIAGNOSIS — Z9889 Other specified postprocedural states: Secondary | ICD-10-CM | POA: Insufficient documentation

## 2014-10-05 DIAGNOSIS — S83242A Other tear of medial meniscus, current injury, left knee, initial encounter: Secondary | ICD-10-CM | POA: Insufficient documentation

## 2014-10-05 DIAGNOSIS — X58XXXA Exposure to other specified factors, initial encounter: Secondary | ICD-10-CM | POA: Insufficient documentation

## 2014-10-15 ENCOUNTER — Encounter: Payer: Self-pay | Admitting: Orthopedic Surgery

## 2014-10-15 ENCOUNTER — Ambulatory Visit (INDEPENDENT_AMBULATORY_CARE_PROVIDER_SITE_OTHER): Payer: BC Managed Care – PPO | Admitting: Orthopedic Surgery

## 2014-10-15 VITALS — BP 102/73 | Ht 67.0 in | Wt 195.0 lb

## 2014-10-15 DIAGNOSIS — M23322 Other meniscus derangements, posterior horn of medial meniscus, left knee: Secondary | ICD-10-CM

## 2014-10-15 DIAGNOSIS — M23329 Other meniscus derangements, posterior horn of medial meniscus, unspecified knee: Secondary | ICD-10-CM | POA: Insufficient documentation

## 2014-10-15 DIAGNOSIS — S83512D Sprain of anterior cruciate ligament of left knee, subsequent encounter: Secondary | ICD-10-CM

## 2014-10-15 NOTE — Patient Instructions (Signed)
acl reconstruction surgery 10/30/14

## 2014-10-15 NOTE — Progress Notes (Signed)
Patient ID: Thomas Heath, male   DOB: 09/26/1991, 23 y.o.   MRN: [id number] Chief Complaint  Patient presents with  . Results    MRI results left knee    The patient's MRI came back as torn anterior cruciate ligament graft and torn medial meniscus  He is interested in surgery repeat reconstruction  Review of systems negative  Recommend patella tendon autograft reconstruction anterior cruciate ligament left knee and partial medial meniscectomy  MRI was reviewed today repeat knee exam confirms large effusion decreased range of motion unstable knee normal motor exam skin normal pulse and sensation  Surgical decision made today.  Out of work 3 months post surgery

## 2014-10-16 ENCOUNTER — Other Ambulatory Visit: Payer: Self-pay | Admitting: *Deleted

## 2014-10-19 ENCOUNTER — Telehealth: Payer: Self-pay | Admitting: Orthopedic Surgery

## 2014-10-19 NOTE — Telephone Encounter (Signed)
[facility] [id number], (or any other CPT codes for out-patient surgery, no pre-authorization is required, per automated voice response system; REF #[id number], 10/19/14, 8:39a.m.

## 2014-10-27 ENCOUNTER — Encounter (HOSPITAL_COMMUNITY)
Admission: RE | Admit: 2014-10-27 | Discharge: 2014-10-27 | Disposition: A | Discharge status: Home / Self Care | Payer: BC Managed Care – PPO | Source: Ambulatory Visit | Attending: Orthopedic Surgery | Admitting: Orthopedic Surgery

## 2014-10-27 ENCOUNTER — Ambulatory Visit (HOSPITAL_COMMUNITY): Payer: BC Managed Care – PPO

## 2014-10-27 ENCOUNTER — Encounter (HOSPITAL_COMMUNITY): Payer: Self-pay

## 2014-10-27 DIAGNOSIS — S83242A Other tear of medial meniscus, current injury, left knee, initial encounter: Secondary | ICD-10-CM | POA: Diagnosis not present

## 2014-10-27 DIAGNOSIS — S83512A Sprain of anterior cruciate ligament of left knee, initial encounter: Secondary | ICD-10-CM | POA: Diagnosis not present

## 2014-10-27 DIAGNOSIS — Y9367 Activity, basketball: Secondary | ICD-10-CM | POA: Diagnosis not present

## 2014-10-27 DIAGNOSIS — Y92838 Other recreation area as the place of occurrence of the external cause: Secondary | ICD-10-CM | POA: Diagnosis not present

## 2014-10-27 DIAGNOSIS — J45909 Unspecified asthma, uncomplicated: Secondary | ICD-10-CM | POA: Diagnosis not present

## 2014-10-27 LAB — CBC
HEMATOCRIT: 41.1 % (ref 39.0–52.0)
HEMOGLOBIN: 14.2 g/dL (ref 13.0–17.0)
MCH: 31.1 pg (ref 26.0–34.0)
MCHC: 34.5 g/dL (ref 30.0–36.0)
MCV: 89.9 fL (ref 78.0–100.0)
Platelets: 334 10*3/uL (ref 150–400)
RBC: 4.57 MIL/uL (ref 4.22–5.81)
RDW: 12.3 % (ref 11.5–15.5)
WBC: 10.8 10*3/uL — ABNORMAL HIGH (ref 4.0–10.5)

## 2014-10-27 LAB — BASIC METABOLIC PANEL
Anion gap: 13 (ref 5–15)
BUN: 12 mg/dL (ref 6–23)
CHLORIDE: 100 meq/L (ref 96–112)
CO2: 27 mEq/L (ref 19–32)
CREATININE: 0.98 mg/dL (ref 0.50–1.35)
Calcium: 9.8 mg/dL (ref 8.4–10.5)
GLUCOSE: 112 mg/dL — AB (ref 70–99)
POTASSIUM: 3.9 meq/L (ref 3.7–5.3)
Sodium: 140 mEq/L (ref 137–147)

## 2014-10-27 NOTE — Patient Instructions (Signed)
Thomas Heath  10/27/2014   Your procedure is scheduled on:   10/30/2014  [facility]  [facility].  Call this number if you have problems the morning of surgery: [phone]   Remember:   Do not eat food or drink liquids after midnight.   Take these medicines the morning of surgery with A SIP OF WATER:  none   Do not wear jewelry, make-up or nail polish.  Do not wear lotions, powders, or perfumes.   Do not shave 48 hours prior to surgery. Men may shave face and neck.  Do not bring valuables to the hospital.  [facility] any belongings or valuables.               Contacts, dentures or bridgework may not be worn into surgery.  Leave suitcase in the car. After surgery it may be brought to your room.  For patients admitted to the hospital, discharge time is determined by your treatment team.               Patients discharged the day of surgery will not be allowed to drive home.  Name and phone number of your driver: family  Special Instructions: Shower using CHG 2 nights before surgery and the night before surgery.  If you shower the day of surgery use CHG.  Use special wash - you have one bottle of CHG for all showers.  You should use approximately 1/3 of the bottle for each shower.   Please read over the following fact sheets that you were given: Pain Booklet, Coughing and Deep Breathing, Surgical Site Infection Prevention, Anesthesia Post-op Instructions and Care and Recovery After Surgery Anterior Cruciate Ligament Reconstruction Anterior cruciate ligament (ACL) reconstruction is a surgical procedure to replace a torn ACL. A ligament is a strong, fibrous, cordlike tissue that connects your bones. Your ACL guides your shin bone (tibia) as it moves in your knee joint. When your ACL is torn, your knee joint loses stability. During ACL reconstruction, a tissue from a ligament from another part of your body or from a donor (graft) is used to replace your  torn ACL. LET [facility] KNOW ABOUT:   Any allergies you have.  Any medicine you are taking, including vitamins, herbs, eye drops, over-the-counter medicines, and creams.  Problems you have had with numbing medicines (local anesthetics) or medicines that make you sleep (general anesthetics).  A family history of problems with anesthetics.  Any blood disorders you have or history of excessive bleeding you have.  Previous surgeries you have had.  Any history of tobacco use.  Any long-term health conditions you have, such as diabetes. RISKS AND COMPLICATIONS Generally, ACL reconstruction is a safe procedure. The following complications are very rare, but they can occur:  Infection.  Failure or repeated rupture of the reconstructed ligament.  Knee stiffness. BEFORE THE PROCEDURE  Your health care provider will instruct you when you need to stop eating and drinking.  Ask your health care provider if you need to change or stop taking any medicines that you regularly take. PROCEDURE  A small incision will be made in your knee. A thin, flexible, tube-like surgical instrument with a camera on the end (arthroscope) will be inserted into your knee joint. Your torn ACL will be removed with the arthroscope. The graft will be inserted into your knee joint with the arthroscope. Guides will be used to place the  graft in the proper position. Tunnels in your upper tibia and femur will be created with large drills, which are placed over guidewires. The graft will be pulled into the tunnels on both your femur and your tibia. The graft will be secured in place in the tunnels with screws.  AFTER THE PROCEDURE You will be taken to the recovery area where a nurse will watch and check your progress. You will be allowed to leave with a family member or other adult who will stay with you once your health care provider feels it is safe for you to go home.  Document Released: 09/12/2004 Document  Revised: 03/30/2014 Document Reviewed: 04/01/2012 [facility] Patient Information 2015 [employer] [place]. This information is not intended to replace advice given to you by your health care provider. Make sure you discuss any questions you have with your health care provider. PATIENT INSTRUCTIONS POST-ANESTHESIA  IMMEDIATELY FOLLOWING SURGERY:  Do not drive or operate machinery for the first twenty four hours after surgery.  Do not make any important decisions for twenty four hours after surgery or while taking narcotic pain medications or sedatives.  If you develop intractable nausea and vomiting or a severe headache please notify your doctor immediately.  FOLLOW-UP:  Please make an appointment with your surgeon as instructed. You do not need to follow up with anesthesia unless specifically instructed to do so.  WOUND CARE INSTRUCTIONS (if applicable):  Keep a dry clean dressing on the anesthesia/puncture wound site if there is drainage.  Once the wound has quit draining you may leave it open to air.  Generally you should leave the bandage intact for twenty four hours unless there is drainage.  If the epidural site drains for more than 36-48 hours please call the anesthesia department.  QUESTIONS?:  Please feel free to call your physician or the hospital operator if you have any questions, and they will be happy to assist you.

## 2014-10-27 NOTE — Pre-Procedure Instructions (Signed)
Patient given information to sign up for my chart at home. 

## 2014-10-28 NOTE — H&P (Addendum)
Patient ID: Thomas Heath, male   DOB: 05/04/1991, 23 y.o.   MRN: [id number] Chief Complaint   Patient presents with   .  Knee Injury       Left knee injury, DOI 09/13/14         Thomas Heath is a 23 y.o. male who presents with a knee injury involving the left knee. Onset was sudden, related to Basketball. Mechanism of injury: Landed awkwardly felt loud pop, immediate swelling and difficulty weightbearing. Current symptoms include: giving out, popping sensation, stiffness and swelling. Pain is aggravated by any weight bearing. Patient has had prior knee problems. About 5 years ago we did an anterior cruciate ligament with an Endobutton and medial meniscal repair open technique. He recovered well.  Evaluation to date: plain films: Endobutton noted on x-ray tunnels appear to be anatomic. Treatment to date: avoidance of offending activity, OTC analgesics which are not very effective, prescription NSAIDS which are not very effective and Hydrocodone.    Past Medical History   Diagnosis  Date   .  Asthma         Past Surgical History   Procedure  Laterality  Date   .  Anterior cruciate ligament repair         History reviewed. No pertinent family history.  Social History History   Substance Use Topics   .  Smoking status:  Never Smoker    .  Smokeless tobacco:  Not on file   .  Alcohol Use:  No     No Known Allergies    Current Outpatient Prescriptions   Medication  Sig  Dispense  Refill   .  celecoxib (CELEBREX) 100 MG capsule  Take 1 capsule (100 mg total) by mouth 2 (two) times daily.   14 capsule   0   .  HYDROcodone-acetaminophen (NORCO/VICODIN) 5-325 MG per tablet  1 or 2 po q4h prn pain   20 tablet   0   .  acetaminophen (TYLENOL) 500 MG tablet  Take 500 mg by mouth every 6 (six) hours as needed for mild pain or moderate pain.            No current facility-administered medications for this visit.       Review of Systems A comprehensive review of systems was negative  except for: Sinusitis, cough, breathing issues wheezing, and related musculoskeletal complaints as noted in the history of present illness   VS BP 158/83  Ht 5\' 8"  (1.727 m)  Wt 195 lb (88.451 kg)  BMI 29.66 kg/m2   APPEARANCE normal muscular   ORIENTATION person place and time normal   MOOD normal   GAIT slightly antalgic left knee      left Knee     Swelling:  Mild   Effusion:  3+   Tenderness:  Medial joint line and Lateral joint line        Range of Motion:       Extension:  -5     Flexion:  100        McMurrays:  Negative   Anterior Lachmann:  2+   Posterior Lachmann:  Negative   Anterior Drawer:  Positive   Posterior Drawer:  Negative   Varus Stress Test:  Negative   Valgus Stress Test:  Negative   Pivot Shift:   could not assess    Patellar Apprehension:  No   Pulses: 2+ and normal both legs Sensation normal both legs DTR normal both legs  LOWER EXTREMITY LYMPH NODES negative both groins  Balance normal  The opposite knee: Normal range of motion, stability, strength. Skin normal.  IMAGING my interpretation of the x-ray normal for acute findings other than effusion old anterior cruciate ligament tunnels using the transtibial technique with Endobutton and place bone plugs in good position  MRI shows recurrent anterior cruciate ligament tear with medial meniscal tear IMPRESSION: 1. Prior ACL repair with complete tear of the ACL graft. Osseous contusions of the anterolateral femoral condyle, posterolateral tibial plateau and posterior medial tibial plateau. 2. Large joint effusion. 3. Radial tear of the posterior horn of the medial meniscus. Complex tear of the body -posterior horn of the medial meniscus. 4. Cartilage abnormalities of the medial and lateral femorotibial compartments as described above.     DX: Left Anterior cruciate ligament recurrent tear, torn medial meniscus area   plan left anterior cruciate ligament reconstruction with patellar  tendon autograft and meniscectomy or meniscal repair

## 2014-10-30 ENCOUNTER — Encounter (HOSPITAL_COMMUNITY)
Admission: RE | Disposition: A | Discharge status: Home / Self Care | Payer: Self-pay | Source: Ambulatory Visit | Attending: Orthopedic Surgery

## 2014-10-30 ENCOUNTER — Encounter (HOSPITAL_COMMUNITY): Payer: Self-pay | Admitting: *Deleted

## 2014-10-30 ENCOUNTER — Ambulatory Visit (HOSPITAL_COMMUNITY): Payer: BC Managed Care – PPO | Admitting: Anesthesiology

## 2014-10-30 ENCOUNTER — Ambulatory Visit (HOSPITAL_COMMUNITY)
Admission: RE | Admit: 2014-10-30 | Discharge: 2014-10-30 | Disposition: A | Discharge status: Home / Self Care | Payer: BC Managed Care – PPO | Source: Ambulatory Visit | Attending: Orthopedic Surgery | Admitting: Orthopedic Surgery

## 2014-10-30 DIAGNOSIS — Y9367 Activity, basketball: Secondary | ICD-10-CM | POA: Insufficient documentation

## 2014-10-30 DIAGNOSIS — S83512A Sprain of anterior cruciate ligament of left knee, initial encounter: Secondary | ICD-10-CM | POA: Insufficient documentation

## 2014-10-30 DIAGNOSIS — S83242A Other tear of medial meniscus, current injury, left knee, initial encounter: Secondary | ICD-10-CM | POA: Insufficient documentation

## 2014-10-30 DIAGNOSIS — S83512D Sprain of anterior cruciate ligament of left knee, subsequent encounter: Secondary | ICD-10-CM

## 2014-10-30 DIAGNOSIS — Y92838 Other recreation area as the place of occurrence of the external cause: Secondary | ICD-10-CM | POA: Insufficient documentation

## 2014-10-30 DIAGNOSIS — J45909 Unspecified asthma, uncomplicated: Secondary | ICD-10-CM | POA: Insufficient documentation

## 2014-10-30 DIAGNOSIS — M23322 Other meniscus derangements, posterior horn of medial meniscus, left knee: Secondary | ICD-10-CM

## 2014-10-30 HISTORY — PX: ANTERIOR CRUCIATE LIGAMENT REPAIR: SHX115

## 2014-10-30 SURGERY — RECONSTRUCTION, KNEE, ACL
Anesthesia: General | Site: Knee | Laterality: Left

## 2014-10-30 MED ORDER — MIDAZOLAM HCL 2 MG/2ML IJ SOLN
INTRAMUSCULAR | Status: AC
  Filled 2014-10-30: qty 2

## 2014-10-30 MED ORDER — LACTATED RINGERS IV SOLN
INTRAVENOUS | Status: DC
  Administered 2014-10-30: 07:00:00 via INTRAVENOUS

## 2014-10-30 MED ORDER — LABETALOL HCL 5 MG/ML IV SOLN
INTRAVENOUS | Status: AC
  Filled 2014-10-30: qty 4

## 2014-10-30 MED ORDER — LIDOCAINE HCL (PF) 1 % IJ SOLN
INTRAMUSCULAR | Status: AC
  Filled 2014-10-30: qty 5

## 2014-10-30 MED ORDER — IBUPROFEN 800 MG PO TABS: 800.0000 mg | ORAL_TABLET | Freq: Three times a day (TID) | ORAL | Status: DC | PRN

## 2014-10-30 MED ORDER — CHLORHEXIDINE GLUCONATE 4 % EX LIQD: 60.0000 mL | Freq: Once | CUTANEOUS | Status: DC

## 2014-10-30 MED ORDER — DEXAMETHASONE SODIUM PHOSPHATE 4 MG/ML IJ SOLN
4.0000 mg | Freq: Once | INTRAMUSCULAR | Status: AC
  Administered 2014-10-30: 4 mg via INTRAVENOUS

## 2014-10-30 MED ORDER — LABETALOL HCL 5 MG/ML IV SOLN
5.0000 mg | Freq: Once | INTRAVENOUS | Status: AC
  Administered 2014-10-30: 5 mg via INTRAVENOUS

## 2014-10-30 MED ORDER — HYDROCODONE-ACETAMINOPHEN 7.5-325 MG PO TABS: 1.0000 | ORAL_TABLET | ORAL | Status: DC | PRN

## 2014-10-30 MED ORDER — LACTATED RINGERS IV SOLN
INTRAVENOUS | Status: DC | PRN
  Administered 2014-10-30 (×2): via INTRAVENOUS

## 2014-10-30 MED ORDER — EPHEDRINE SULFATE 50 MG/ML IJ SOLN
INTRAMUSCULAR | Status: AC
  Filled 2014-10-30: qty 1

## 2014-10-30 MED ORDER — FENTANYL CITRATE 0.05 MG/ML IJ SOLN
INTRAMUSCULAR | Status: AC
  Filled 2014-10-30: qty 2

## 2014-10-30 MED ORDER — GLYCOPYRROLATE 0.2 MG/ML IJ SOLN
INTRAMUSCULAR | Status: DC | PRN
  Administered 2014-10-30: 0.6 mg via INTRAVENOUS
  Administered 2014-10-30: 0.2 mg via INTRAVENOUS

## 2014-10-30 MED ORDER — ONDANSETRON HCL 4 MG/2ML IJ SOLN
4.0000 mg | Freq: Once | INTRAMUSCULAR | Status: AC
  Administered 2014-10-30: 4 mg via INTRAVENOUS

## 2014-10-30 MED ORDER — PROPOFOL 10 MG/ML IV BOLUS
INTRAVENOUS | Status: AC
  Filled 2014-10-30: qty 20

## 2014-10-30 MED ORDER — PROPOFOL 10 MG/ML IV BOLUS
INTRAVENOUS | Status: DC | PRN
  Administered 2014-10-30: 40 mg via INTRAVENOUS
  Administered 2014-10-30: 150 mg via INTRAVENOUS

## 2014-10-30 MED ORDER — SUFENTANIL CITRATE 50 MCG/ML IV SOLN
INTRAVENOUS | Status: AC
  Filled 2014-10-30: qty 1

## 2014-10-30 MED ORDER — BUPIVACAINE-EPINEPHRINE 0.5% -1:200000 IJ SOLN
INTRAMUSCULAR | Status: DC | PRN
  Administered 2014-10-30: 60 mL

## 2014-10-30 MED ORDER — CEFAZOLIN SODIUM-DEXTROSE 2-3 GM-% IV SOLR
INTRAVENOUS | Status: AC
  Filled 2014-10-30: qty 50

## 2014-10-30 MED ORDER — ROCURONIUM BROMIDE 50 MG/5ML IV SOLN
INTRAVENOUS | Status: AC
  Filled 2014-10-30: qty 1

## 2014-10-30 MED ORDER — PROMETHAZINE HCL 12.5 MG PO TABS: 12.5000 mg | ORAL_TABLET | Freq: Four times a day (QID) | ORAL | Status: DC | PRN

## 2014-10-30 MED ORDER — GLYCOPYRROLATE 0.2 MG/ML IJ SOLN
INTRAMUSCULAR | Status: AC
  Filled 2014-10-30: qty 3

## 2014-10-30 MED ORDER — SODIUM CHLORIDE 0.9 % IJ SOLN
INTRAMUSCULAR | Status: AC
  Filled 2014-10-30: qty 10

## 2014-10-30 MED ORDER — DEXAMETHASONE SODIUM PHOSPHATE 4 MG/ML IJ SOLN
INTRAMUSCULAR | Status: AC
  Filled 2014-10-30: qty 1

## 2014-10-30 MED ORDER — ONDANSETRON HCL 4 MG/2ML IJ SOLN: 4.0000 mg | Freq: Once | INTRAMUSCULAR | Status: DC | PRN

## 2014-10-30 MED ORDER — SUCCINYLCHOLINE CHLORIDE 20 MG/ML IJ SOLN
INTRAMUSCULAR | Status: AC
  Filled 2014-10-30: qty 1

## 2014-10-30 MED ORDER — MIDAZOLAM HCL 2 MG/2ML IJ SOLN
1.0000 mg | INTRAMUSCULAR | Status: DC | PRN
  Administered 2014-10-30 (×2): 2 mg via INTRAVENOUS

## 2014-10-30 MED ORDER — MIDAZOLAM HCL 5 MG/5ML IJ SOLN
INTRAMUSCULAR | Status: DC | PRN
  Administered 2014-10-30: 2 mg via INTRAVENOUS

## 2014-10-30 MED ORDER — ARTIFICIAL TEARS OP OINT
TOPICAL_OINTMENT | OPHTHALMIC | Status: DC | PRN
  Administered 2014-10-30: 1 via OPHTHALMIC

## 2014-10-30 MED ORDER — FENTANYL CITRATE 0.05 MG/ML IJ SOLN
25.0000 ug | INTRAMUSCULAR | Status: DC | PRN
  Administered 2014-10-30 (×2): 50 ug via INTRAVENOUS

## 2014-10-30 MED ORDER — EPINEPHRINE HCL 1 MG/ML IJ SOLN
INTRAMUSCULAR | Status: AC
  Filled 2014-10-30: qty 4

## 2014-10-30 MED ORDER — CEFAZOLIN SODIUM-DEXTROSE 2-3 GM-% IV SOLR
2.0000 g | INTRAVENOUS | Status: AC
  Administered 2014-10-30: 2 g via INTRAVENOUS

## 2014-10-30 MED ORDER — GLYCOPYRROLATE 0.2 MG/ML IJ SOLN
INTRAMUSCULAR | Status: AC
  Filled 2014-10-30: qty 1

## 2014-10-30 MED ORDER — ROCURONIUM BROMIDE 100 MG/10ML IV SOLN
INTRAVENOUS | Status: DC | PRN
  Administered 2014-10-30: 40 mg via INTRAVENOUS
  Administered 2014-10-30: 10 mg via INTRAVENOUS

## 2014-10-30 MED ORDER — SUFENTANIL CITRATE 50 MCG/ML IV SOLN
INTRAVENOUS | Status: DC | PRN
  Administered 2014-10-30 (×5): 10 ug via INTRAVENOUS

## 2014-10-30 MED ORDER — EPINEPHRINE HCL 1 MG/ML IJ SOLN
INTRAMUSCULAR | Status: AC
  Filled 2014-10-30: qty 1

## 2014-10-30 MED ORDER — BUPIVACAINE-EPINEPHRINE (PF) 0.5% -1:200000 IJ SOLN
INTRAMUSCULAR | Status: AC
  Filled 2014-10-30: qty 60

## 2014-10-30 MED ORDER — NEOSTIGMINE METHYLSULFATE 10 MG/10ML IV SOLN
INTRAVENOUS | Status: DC | PRN
  Administered 2014-10-30: 4 mg via INTRAVENOUS

## 2014-10-30 MED ORDER — NEOSTIGMINE METHYLSULFATE 10 MG/10ML IV SOLN
INTRAVENOUS | Status: AC
  Filled 2014-10-30: qty 1

## 2014-10-30 MED ORDER — ONDANSETRON HCL 4 MG/2ML IJ SOLN
INTRAMUSCULAR | Status: AC
  Filled 2014-10-30: qty 2

## 2014-10-30 MED ORDER — ARTIFICIAL TEARS OP OINT
TOPICAL_OINTMENT | OPHTHALMIC | Status: AC
Start: 2014-10-30 — End: 2014-10-30
  Filled 2014-10-30: qty 3.5

## 2014-10-30 MED ORDER — LIDOCAINE HCL (CARDIAC) 20 MG/ML IV SOLN
INTRAVENOUS | Status: DC | PRN
  Administered 2014-10-30: 50 mg via INTRAVENOUS

## 2014-10-30 MED ORDER — SODIUM CHLORIDE 0.9 % IR SOLN
Status: DC | PRN
  Administered 2014-10-30: 1000 mL

## 2014-10-30 MED ORDER — SODIUM CHLORIDE 0.9 % IR SOLN
Status: DC | PRN
  Administered 2014-10-30 (×8): 3000 mL

## 2014-10-30 SURGICAL SUPPLY — 91 items
BANDAGE ELASTIC 6 VELCRO ST LF (GAUZE/BANDAGES/DRESSINGS) ×2 IMPLANT
BANDAGE ESMARK 6X9 LF (GAUZE/BANDAGES/DRESSINGS) ×1 IMPLANT
BIT DRILL 2.0MX128MM (BIT) ×3 IMPLANT
BIT DRILL 2.4X128 (BIT) IMPLANT
BIT DRILL 2.4X128MM (BIT)
BIT DRILL 2.8X128 (BIT) ×1 IMPLANT
BIT DRILL 2.8X128MM (BIT)
BLADE AGGRESSIVE PLUS 4.0 (BLADE) ×3 IMPLANT
BLADE OSC/SAGITTAL MD 9X18.5 (BLADE) ×3 IMPLANT
BLADE SURG 15 STRL LF DISP TIS (BLADE) ×1 IMPLANT
BLADE SURG 15 STRL SS (BLADE) ×3
BLADE SURG SZ10 CARB STEEL (BLADE) ×3 IMPLANT
BNDG CMPR 9X6 STRL LF SNTH (GAUZE/BANDAGES/DRESSINGS) ×1
BNDG ESMARK 6X9 LF (GAUZE/BANDAGES/DRESSINGS) ×3
BRACE T-SCOPE KNEE POSTOP (MISCELLANEOUS) ×3 IMPLANT
BTB TIGHTROPE WITH DRILL PIN ×2 IMPLANT
BUR 5.0 BARRELL (BURR) IMPLANT
BUR AGGRESSIVE PLUS 5.0 (BURR) ×1 IMPLANT
BUR BARRELL 4.0 (BURR) IMPLANT
BUR ROUND 5.0 (BURR) ×2 IMPLANT
CHLORAPREP W/TINT 26ML (MISCELLANEOUS) ×4 IMPLANT
CLOTH BEACON ORANGE TIMEOUT ST (SAFETY) ×3 IMPLANT
COOLER CRYO CUFF IC AND MOTOR (MISCELLANEOUS) ×3 IMPLANT
COVER LIGHT HANDLE STERIS (MISCELLANEOUS) ×6 IMPLANT
CUFF CRYO KNEE LG 20X31 COOLER (ORTHOPEDIC SUPPLIES) ×1 IMPLANT
CUFF CRYO KNEE18X23 MED (MISCELLANEOUS) ×3 IMPLANT
CUFF TOURNIQUET SINGLE 34IN LL (TOURNIQUET CUFF) ×3 IMPLANT
CUTTER TOMCAT 5.0MM (BURR) ×2 IMPLANT
DECANTER SPIKE VIAL GLASS SM (MISCELLANEOUS) ×6 IMPLANT
DRILL FLIPCUTTER II 10MM (CUTTER) IMPLANT
ELECT REM PT RETURN 9FT ADLT (ELECTROSURGICAL) ×3
ELECTRODE REM PT RTRN 9FT ADLT (ELECTROSURGICAL) ×1 IMPLANT
FIBERSTICK 2 (SUTURE) ×3 IMPLANT
FLIPCUTTER II 10MM (CUTTER) ×3
FLOOR PAD 36X40 (MISCELLANEOUS)
GAUZE SPONGE 4X4 12PLY STRL (GAUZE/BANDAGES/DRESSINGS) IMPLANT
GAUZE SPONGE 4X4 16PLY XRAY LF (GAUZE/BANDAGES/DRESSINGS) ×3 IMPLANT
GAUZE XEROFORM 5X9 LF (GAUZE/BANDAGES/DRESSINGS) ×3 IMPLANT
GLOVE SKINSENSE NS SZ8.0 LF (GLOVE) ×2
GLOVE SKINSENSE STRL SZ8.0 LF (GLOVE) ×1 IMPLANT
GLOVE SS N UNI LF 8.5 STRL (GLOVE) ×3 IMPLANT
GOWN STRL REUS W/TWL LRG LVL3 (GOWN DISPOSABLE) ×12 IMPLANT
GOWN STRL REUS W/TWL XL LVL3 (GOWN DISPOSABLE) ×3 IMPLANT
HLDR LEG FOAM (MISCELLANEOUS) ×1 IMPLANT
INST SET MINOR BONE (KITS) ×3 IMPLANT
IV NS IRRIG 3000ML ARTHROMATIC (IV SOLUTION) ×23 IMPLANT
KIT BLADEGUARD II DBL (SET/KITS/TRAYS/PACK) ×3 IMPLANT
KIT ROOM TURNOVER AP CYSTO (KITS) ×3 IMPLANT
KIT TRANSTIBIAL (DISPOSABLE) ×3 IMPLANT
LEG HOLDER FOAM (MISCELLANEOUS) ×2
MANIFOLD NEPTUNE II (INSTRUMENTS) ×3 IMPLANT
MARKER SKIN DUAL TIP RULER LAB (MISCELLANEOUS) ×3 IMPLANT
NDL HYPO 21X1.5 SAFETY (NEEDLE) ×1 IMPLANT
NDL SPNL 18GX3.5 QUINCKE PK (NEEDLE) ×1 IMPLANT
NEEDLE HYPO 21X1.5 SAFETY (NEEDLE) ×3 IMPLANT
NEEDLE SPNL 18GX3.5 QUINCKE PK (NEEDLE) ×3 IMPLANT
NS IRRIG 1000ML POUR BTL (IV SOLUTION) ×3 IMPLANT
PACK ARTHRO LIMB DRAPE STRL (MISCELLANEOUS) ×3 IMPLANT
PACK BASIC III (CUSTOM PROCEDURE TRAY) ×3
PACK SRG BSC III STRL LF ECLPS (CUSTOM PROCEDURE TRAY) ×1 IMPLANT
PAD ABD 5X9 TENDERSORB (GAUZE/BANDAGES/DRESSINGS) ×4 IMPLANT
PAD ARMBOARD 7.5X6 YLW CONV (MISCELLANEOUS) ×3 IMPLANT
PAD FLOOR 36X40 (MISCELLANEOUS) ×1 IMPLANT
PADDING CAST COTTON 6X4 STRL (CAST SUPPLIES) ×2 IMPLANT
PADDING WEBRIL 6 STERILE (GAUZE/BANDAGES/DRESSINGS) ×2 IMPLANT
PENCIL HANDSWITCHING (ELECTRODE) ×3 IMPLANT
RASP SM TEAR CROSS CUT (RASP) ×2 IMPLANT
SCREW BIO INTER 9X28 (Screw) ×2 IMPLANT
SET ARTHROSCOPY INST (INSTRUMENTS) ×3 IMPLANT
SET ARTHROSCOPY PUMP TUBE (IRRIGATION / IRRIGATOR) ×3 IMPLANT
SET BASIN LINEN APH (SET/KITS/TRAYS/PACK) ×3 IMPLANT
SPONGE GAUZE 4X4 12PLY (GAUZE/BANDAGES/DRESSINGS) ×2 IMPLANT
SPONGE LAP 18X18 X RAY DECT (DISPOSABLE) ×3 IMPLANT
STAPLER VISISTAT 35W (STAPLE) ×2 IMPLANT
SUT BRALON NAB BRD #1 30IN (SUTURE) ×1 IMPLANT
SUT ETHIBOND 5 LR DA (SUTURE) ×4 IMPLANT
SUT ETHIBOND NAB OS 4 #2 30IN (SUTURE) IMPLANT
SUT ETHILON 3 0 FSL (SUTURE) IMPLANT
SUT ETHILON 3 0 PS 1 (SUTURE) ×2 IMPLANT
SUT MON AB 0 CT1 (SUTURE) ×3 IMPLANT
SUT MON AB 2-0 CT1 36 (SUTURE) ×3 IMPLANT
SUT PROLENE 3 0 PS 2 (SUTURE) IMPLANT
SUT VIC AB 1 CT1 27 (SUTURE) ×3
SUT VIC AB 1 CT1 27XBRD ANTBC (SUTURE) ×1 IMPLANT
SYR 30ML LL (SYRINGE) ×5 IMPLANT
SYR BULB IRRIGATION 50ML (SYRINGE) ×6 IMPLANT
TOWEL OR 17X26 4PK STRL BLUE (TOWEL DISPOSABLE) ×3 IMPLANT
WAND 50 DEG COVAC W/CORD (SURGICAL WAND) ×2 IMPLANT
WAND 90 DEG TURBOVAC W/CORD (SURGICAL WAND) ×2 IMPLANT
YANKAUER SUCT 12FT TUBE ARGYLE (SUCTIONS) ×3 IMPLANT
YANKAUER SUCT BULB TIP 10FT TU (MISCELLANEOUS) ×9 IMPLANT

## 2014-10-30 NOTE — Progress Notes (Signed)
Patient entered operating room at 0741, Vendor Tray opened and missing instrument. Patient remained on stretcher and exited operating room at 0757 to return to pre-operative area until [facility]

## 2014-10-30 NOTE — Interval H&P Note (Signed)
History and Physical Interval Note:  10/30/2014 7:28 AM  Thomas Heath  has presented today for surgery, with the diagnosis of LEFT ACL TEAR  The various methods of treatment have been discussed with the patient and family. After consideration of risks, benefits and other options for treatment, the patient has consented to  Procedure(s): RECONSTRUCTION ANTERIOR CRUCIATE LIGAMENT (ACL) (Left) as a surgical intervention .  The patient's history has been reviewed, patient examined, no change in status, stable for surgery.  I have reviewed the patient's chart and labs.  Questions were answered to the patient's satisfaction.     Fuller CanadaStanley Beaulah Romanek

## 2014-10-30 NOTE — OR Nursing (Signed)
Patient went to OR this morning around 0735 for procedure. Returned to preop holding because instrument needed for OR case was not here so patient waiting in holding.  Patient went back to OR at 0905.

## 2014-10-30 NOTE — Anesthesia Preprocedure Evaluation (Signed)
Anesthesia Evaluation  Patient identified by MRN, date of birth, ID band Patient awake    Reviewed: Allergy & Precautions, H&P , NPO status , Patient's Chart, lab work & pertinent test results  Airway Mallampati: I  TM Distance: >3 FB     Dental  (+) Teeth Intact   Pulmonary asthma (no inhaler now) ,  breath sounds clear to auscultation        Cardiovascular hypertension (new onset , no meds), Rhythm:Regular Rate:Normal     Neuro/Psych    GI/Hepatic negative GI ROS,   Endo/Other    Renal/GU      Musculoskeletal   Abdominal   Peds  Hematology   Anesthesia Other Findings   Reproductive/Obstetrics                             Anesthesia Physical Anesthesia Plan  ASA: II  Anesthesia Plan: General   Post-op Pain Management:    Induction: Intravenous  Airway Management Planned: Oral ETT  Additional Equipment:   Intra-op Plan:   Post-operative Plan: Extubation in OR  Informed Consent: I have reviewed the patients History and Physical, chart, labs and discussed the procedure including the risks, benefits and alternatives for the proposed anesthesia with the patient or authorized representative who has indicated his/her understanding and acceptance.     Plan Discussed with:   Anesthesia Plan Comments: (preop labetolol ordered)        Anesthesia Quick Evaluation

## 2014-10-30 NOTE — Brief Op Note (Signed)
10/30/2014  12:10 PM  PATIENT:  Thomas Heath  23 y.o. male  PRE-OPERATIVE DIAGNOSIS:  LEFT ANTERIOR CRUCIATE LIGAMENT TEAR  POST-OPERATIVE DIAGNOSIS:  LEFT ANTERIOR CRUCIATE LIGAMENT TEAR, MEDIAL MENISUS  PROCEDURE:  Procedure(s): RECONSTRUCTION ANTERIOR CRUCIATE LIGAMENT (ACL) LEFT, PATELLER- TENDON AUTOGRAFT, MEDIAL MENISECTOMY (Left)  S

## 2014-10-30 NOTE — Transfer of Care (Signed)
Immediate Anesthesia Transfer of Care Note  Patient: Thomas Heath  Procedure(s) Performed: Procedure(s): RECONSTRUCTION ANTERIOR CRUCIATE LIGAMENT (ACL) LEFT, PATELLER- TENDON AUTOGRAFT, MEDIAL MENISECTOMY (Left)  Patient Location: PACU  Anesthesia Type:General  Level of Consciousness: sedated and patient cooperative  Airway & Oxygen Therapy: Patient Spontanous Breathing and Patient connected to face mask oxygen  Post-op Assessment: Report given to PACU RN and Post -op Vital signs reviewed and stable  Post vital signs: Reviewed and stable  Complications: No apparent anesthesia complications

## 2014-10-30 NOTE — Anesthesia Postprocedure Evaluation (Signed)
  Anesthesia Post-op Note  Patient: Thomas Heath  Procedure(s) Performed: Procedure(s): RECONSTRUCTION ANTERIOR CRUCIATE LIGAMENT (ACL) LEFT, PATELLER- TENDON AUTOGRAFT, MEDIAL MENISECTOMY (Left)  Patient Location: PACU  Anesthesia Type:General  Level of Consciousness: awake, alert , oriented and patient cooperative  Airway and Oxygen Therapy: Patient Spontanous Breathing  Post-op Pain: mild  Post-op Assessment: Post-op Vital signs reviewed, Patient's Cardiovascular Status Stable, Respiratory Function Stable, Patent Airway, No signs of Nausea or vomiting, Pain level controlled and No headache  Post-op Vital Signs: Reviewed and stable  Last Vitals:  Filed Vitals:   10/30/14 1245  BP: 146/88  Pulse: 82  Temp:   Resp: 9    Complications: No apparent anesthesia complications

## 2014-10-30 NOTE — Anesthesia Procedure Notes (Signed)
Procedure Name: Intubation Date/Time: 10/30/2014 9:14 AM Performed by: Pernell DupreADAMS, Thomas Girvan A Pre-anesthesia Checklist: Patient identified, Patient being monitored, Timeout performed, Emergency Drugs available and Suction available Patient Re-evaluated:Patient Re-evaluated prior to inductionOxygen Delivery Method: Circle System Utilized Preoxygenation: Pre-oxygenation with 100% oxygen Intubation Type: IV induction Ventilation: Mask ventilation without difficulty Laryngoscope Size: 3 and Miller Grade View: Grade I Tube type: Oral Tube size: 7.0 mm Number of attempts: 1 Airway Equipment and Method: stylet Placement Confirmation: ETT inserted through vocal cords under direct vision,  positive ETCO2 and breath sounds checked- equal and bilateral Secured at: 21 cm Tube secured with: Tape Dental Injury: Teeth and Oropharynx as per pre-operative assessment

## 2014-11-02 ENCOUNTER — Telehealth: Payer: Self-pay | Admitting: Orthopedic Surgery

## 2014-11-02 ENCOUNTER — Encounter: Payer: Self-pay | Admitting: Orthopedic Surgery

## 2014-11-02 ENCOUNTER — Ambulatory Visit (INDEPENDENT_AMBULATORY_CARE_PROVIDER_SITE_OTHER): Payer: Self-pay | Admitting: Orthopedic Surgery

## 2014-11-02 VITALS — BP 144/87 | Ht 67.0 in | Wt 203.8 lb

## 2014-11-02 DIAGNOSIS — Z9889 Other specified postprocedural states: Secondary | ICD-10-CM

## 2014-11-02 DIAGNOSIS — S83512D Sprain of anterior cruciate ligament of left knee, subsequent encounter: Secondary | ICD-10-CM

## 2014-11-02 MED ORDER — HYDROCODONE-ACETAMINOPHEN 7.5-325 MG PO TABS: 1.0000 | ORAL_TABLET | ORAL | Status: DC | PRN

## 2014-11-02 NOTE — Progress Notes (Signed)
Patient ID: Thomas Heath, male   DOB: 06/05/1991, 23 y.o.   MRN: [id number] Chief Complaint  Patient presents with  . Follow-up    post op 1, LEFT ACL, DOS 10/30/14   Encounter Diagnoses  Name Primary?  . Left ACL tear, subsequent encounter Yes  . S/P ACL surgery     Post op ACL autograft patella tendon  Doing well. Dressing changed; WOUND CLEAN   Start therapy  Refill norco 7.5 wbat in brace  Cryotherapy  Return 17th for staples

## 2014-11-02 NOTE — Telephone Encounter (Signed)
Fax of notes, operative and office date of service 11/02/14 to [facility] [employer] [employer], patient's short-term disability payor, ph# [phone], fax# [phone]; signed authorization on file.  Patient aware of status.

## 2014-11-02 NOTE — Brief Op Note (Signed)
10/30/2014  8:58 AM  PATIENT:  Thomas Heath  23 y.o. male  PRE-OPERATIVE DIAGNOSIS:  LEFT ANTERIOR CRUCIATE LIGAMENT TEAR  POST-OPERATIVE DIAGNOSIS:  LEFT ANTERIOR CRUCIATE LIGAMENT TEAR, MEDIAL MENISUS tear  PROCEDURE:  Procedure(s): RECONSTRUCTION ANTERIOR CRUCIATE LIGAMENT (ACL) LEFT, PATELLER- TENDON AUTOGRAFT, MEDIAL MENISECTOMY (Left)   Operative findings: #1 complete rupture the previous anterior cruciate ligament allograft #2 tear of the posterior horn and body of the medial meniscus #3 grade 2 degenerative changes of the medial femoral condyle on the weightbearing surface  SURGEON:  Surgeon(s) and Role:    * Vickki HearingStanley E Curties Conigliaro, MD - Primary  PHYSICIAN ASSISTANT:   ASSISTANTS: Fayette NationBetty Ashley   ANESTHESIA:   general  EBL:     BLOOD ADMINISTERED:none  DRAINS: none   LOCAL MEDICATIONS USED:  MARCAINE     SPECIMEN:  No Specimen  DISPOSITION OF SPECIMEN:  N/A  COUNTS:  YES  TOURNIQUET:    DICTATION: .Dragon Dictation  PLAN OF CARE: Discharge to home after PACU  PATIENT DISPOSITION:  PACU - hemodynamically stable.   Delay start of Pharmacological VTE agent (>24hrs) due to surgical blood loss or risk of bleeding: no  Details of procedure  The patient was identified in the preop area using to approve identification markers. The left knee was confirmed as a surgical site was marked with the surgeon's initials. Complete chart update completed  The patient was taken back to the operating room and he was given appropriate dose of Ancef. He was given general anesthesia and placed in the supine position with the left leg in an arthroscopic leg holder and the right leg padded  The knee underwent sterile prep and drape the timeout procedure was completed.  Prior to placing the leg in the arthroscopic leg holder was examined under anesthesia and found to have a 2+ Lachman and a 2+ pivot shift with no collateral ligament instability.  Standard arthroscopy portals  were established diagnostic arthroscopy was performed. Medial meniscectomy was performed through a medial portal using a combination of arthroscopic instruments including shaver, straight duckbill forceps, up-biting forcep, ArthroCare wand 90 and 50.  Lateral compartment was normal the patellofemoral joint was normal as well.  The remaining portions of the anterior cruciate ligament allograft were resected. A notchplasty was performed secondary to bony overgrowth.  A midline incision was then made over the patellar tendon taken down to the peritenon. The tendon measured 30 mm wide. We took the central third of the tendon with 2 bone blocks 1 superior 1 inferior using oscillating saw. We then prepared the graft on the back table to a 90 mm graft with 2 bone plugs a 20 mm bone plug and a 22 mm bone plug.  The graft was stretched on a Graftmaster with a tight rope positioned per manufacture technique  The scope was then placed back into the joint the foot cutter was used to make the femoral tunnel. This was made in the anatomic footprint which was still in place as it had not been violated from the prior trans-tibial technique.  We then turned our attention to the tibia and made a tibial tunnel in the anatomic position as well. This did coincide with the previous tunnel.  No hardware or residual tissue was noted in terms of screw fixation.  The graft was then passed through the tunnels and maneuvered into place using a tight rope per manufacture technique. A fIrm tug on the distal graft confirmed its position in terms of the button.  We  then noticed that the grafted retract as the knee was placed into extension and we cycled the knee several times to remove any creep in the graft  We then placed a absorbable screw in the tibial tunnel using a trailblazer dilator and measure.  Lachman test confirmed excellent fixation and stabilization of the knee  All wounds were irrigated  The flip cutter  incision was closed with 2-0 Monocryl and staples  The patella was bone grafted with residual bone from preparing the graft. The patella tendon was closed with 0 Monocryl followed by 0 Monocryl and 2-0 Monocryl and staples to close the skin.   We then used Marcaine with epinephrine to supplement postop pain relief  The knee was placed in sterile dressings Cryo/Cuff and brace locked in extension  Extubation was performed and patient was taken to recovery room in stable condition

## 2014-11-02 NOTE — Patient Instructions (Signed)
Call [facility] to start therapy this week

## 2014-11-02 NOTE — Op Note (Signed)
10/30/2014  8:58 AM  PATIENT:  Thomas Heath  23 y.o. male  PRE-OPERATIVE DIAGNOSIS:  LEFT ANTERIOR CRUCIATE LIGAMENT TEAR  POST-OPERATIVE DIAGNOSIS:  LEFT ANTERIOR CRUCIATE LIGAMENT TEAR, MEDIAL MENISUS tear  PROCEDURE:  Procedure(s): RECONSTRUCTION ANTERIOR CRUCIATE LIGAMENT (ACL) LEFT, PATELLER- TENDON AUTOGRAFT, MEDIAL MENISECTOMY (Left)   Operative findings: #1 complete rupture the previous anterior cruciate ligament allograft #2 tear of the posterior horn and body of the medial meniscus #3 grade 2 degenerative changes of the medial femoral condyle on the weightbearing surface  SURGEON:  Surgeon(s) and Role:    * Vickki HearingStanley E Harrison, MD - Primary  PHYSICIAN ASSISTANT:   ASSISTANTS: Fayette NationBetty Ashley   ANESTHESIA:   general  EBL:     BLOOD ADMINISTERED:none  DRAINS: none   LOCAL MEDICATIONS USED:  MARCAINE     SPECIMEN:  No Specimen  DISPOSITION OF SPECIMEN:  N/A  COUNTS:  YES  TOURNIQUET:    DICTATION: .Dragon Dictation  PLAN OF CARE: Discharge to home after PACU  PATIENT DISPOSITION:  PACU - hemodynamically stable.   Delay start of Pharmacological VTE agent (>24hrs) due to surgical blood loss or risk of bleeding: no  Details of procedure  The patient was identified in the preop area using to approve identification markers. The left knee was confirmed as a surgical site was marked with the surgeon's initials. Complete chart update completed  The patient was taken back to the operating room and he was given appropriate dose of Ancef. He was given general anesthesia and placed in the supine position with the left leg in an arthroscopic leg holder and the right leg padded  The knee underwent sterile prep and drape the timeout procedure was completed.  Prior to placing the leg in the arthroscopic leg holder was examined under anesthesia and found to have a 2+ Lachman and a 2+ pivot shift with no collateral ligament instability.  Standard arthroscopy portals  were established diagnostic arthroscopy was performed. Medial meniscectomy was performed through a medial portal using a combination of arthroscopic instruments including shaver, straight duckbill forceps, up-biting forcep, ArthroCare wand 90 and 50.  Lateral compartment was normal the patellofemoral joint was normal as well.  The remaining portions of the anterior cruciate ligament allograft were resected. A notchplasty was performed secondary to bony overgrowth.  A midline incision was then made over the patellar tendon taken down to the peritenon. The tendon measured 30 mm wide. We took the central third of the tendon with 2 bone blocks 1 superior 1 inferior using oscillating saw. We then prepared the graft on the back table to a 90 mm graft with 2 bone plugs a 20 mm bone plug and a 22 mm bone plug.  The graft was stretched on a Graftmaster with a tight rope positioned per manufacture technique  The scope was then placed back into the joint the foot cutter was used to make the femoral tunnel. This was made in the anatomic footprint which was still in place as it had not been violated from the prior trans-tibial technique.  We then turned our attention to the tibia and made a tibial tunnel in the anatomic position as well. This did coincide with the previous tunnel.  No hardware or residual tissue was noted in terms of screw fixation.  The graft was then passed through the tunnels and maneuvered into place using a tight rope per manufacture technique. A fIrm tug on the distal graft confirmed its position in terms of the button.  We  then noticed that the grafted retract as the knee was placed into extension and we cycled the knee several times to remove any creep in the graft  We then placed a absorbable screw in the tibial tunnel using a trailblazer dilator and measure.  Lachman test confirmed excellent fixation and stabilization of the knee  All wounds were irrigated  The flip cutter  incision was closed with 2-0 Monocryl and staples  The patella was bone grafted with residual bone from preparing the graft. The patella tendon was closed with 0 Monocryl followed by 0 Monocryl and 2-0 Monocryl and staples to close the skin.   We then used Marcaine with epinephrine to supplement postop pain relief  The knee was placed in sterile dressings Cryo/Cuff and brace locked in extension  Extubation was performed and patient was taken to recovery room in stable condition  

## 2014-11-03 ENCOUNTER — Encounter (HOSPITAL_COMMUNITY): Payer: Self-pay | Admitting: Orthopedic Surgery

## 2014-11-09 ENCOUNTER — Ambulatory Visit (HOSPITAL_COMMUNITY)
Admission: RE | Admit: 2014-11-09 | Discharge: 2014-11-09 | Disposition: A | Discharge status: Home / Self Care | Payer: BC Managed Care – PPO | Source: Ambulatory Visit | Attending: Orthopedic Surgery | Admitting: Orthopedic Surgery

## 2014-11-09 DIAGNOSIS — M6281 Muscle weakness (generalized): Secondary | ICD-10-CM | POA: Insufficient documentation

## 2014-11-09 DIAGNOSIS — S83512D Sprain of anterior cruciate ligament of left knee, subsequent encounter: Secondary | ICD-10-CM | POA: Insufficient documentation

## 2014-11-09 DIAGNOSIS — M25562 Pain in left knee: Secondary | ICD-10-CM | POA: Diagnosis not present

## 2014-11-09 DIAGNOSIS — Z4789 Encounter for other orthopedic aftercare: Secondary | ICD-10-CM

## 2014-11-09 DIAGNOSIS — R29898 Other symptoms and signs involving the musculoskeletal system: Secondary | ICD-10-CM

## 2014-11-09 DIAGNOSIS — M25662 Stiffness of left knee, not elsewhere classified: Secondary | ICD-10-CM | POA: Insufficient documentation

## 2014-11-09 NOTE — Patient Instructions (Addendum)
Quad Set   Slowly tighten thigh muscles of straight leg while counting out loud to __5__. Relax. Repeat ___10_ times. Do _10/day__   Bend knee and pull heel toward buttocks. Hold _3___ seconds. Return. Repeat with other knee. Repeat ___10_ times. Do __3__ sessions per day.  http://gt2.exer.us/371   Copyright  VHI. All rights reserved.  _ sessions per day. Copyright  VHI. All rights reserved.  Bent Leg Lift (Hook-Lying)   Tighten stomach and slowly raise right leg __3__ inches from floor. Keep trunk rigid. Hold __3__ seconds. Repeat ___10_ times per set. Do ___1_ sets per session. Do ___3_ sessions per day.  http://orth.exer.us/1090   Copyright  VHI. All rights reserved.  Self-Mobilization: Knee Flexion (Prone)   Bring left heel toward buttocks as close as possible. Hold ___2_ seconds. Relax. Repeat __10__ times per set. Do ___1_ sets per session. Do ___3_ sessions per day.  http://orth.exer.us/596   Copyright  VHI. All rights reserved.  Strengthening: Hip Extension (Prone)   Tighten muscles on front of left thigh, then lift leg ____ inches from surface, keeping knee locked. Repeat ____ times per set. Do ____ sets per session. Do ____ sessions per day.  http://orth.exer.us/620   Copyright  VHI. All rights reserved.  Hip Extension (Prone) Hip Adduction: Side-Lying (Single Leg)   Side-lying, anchor tubing under foot of bent leg. Loop around ankle of straight leg with twist. Raise bottom leg, keeping knee straight. Repeat __ times per set. Repeat with other leg. Do __ sets per session. Do __ sessions per week.  http://tub.exer.us/205   Copyright  VHI. All rights reserved.    Copyright  VHI. All rights reserved.

## 2014-11-09 NOTE — Therapy (Signed)
[facility] [address] [place], [place], [address] Phone: [phone]   Fax:  [phone]  Physical Therapy Evaluation  Patient Details  Name: Thomas Heath MRN: [id number] Date of Birth: 09/22/1991  Encounter Date: 11/09/2014      PT End of Session - 11/09/14 1007    Activity Tolerance Patient tolerated treatment well   Behavior During Therapy [facility] for tasks assessed/performed      Past Medical History  Diagnosis Date  . Asthma     Past Surgical History  Procedure Laterality Date  . Anterior cruciate ligament repair Left   . Anterior cruciate ligament repair Left 10/30/2014    Procedure: RECONSTRUCTION ANTERIOR CRUCIATE LIGAMENT (ACL) LEFT, PATELLER- TENDON AUTOGRAFT, MEDIAL MENISECTOMY;  Surgeon: Vickki HearingStanley E Harrison, MD;  Location: [facility];  Service: Orthopedics;  Laterality: Left;    There were no vitals taken for this visit.  Visit Diagnosis:  Aftercare for anterior cruciate ligament (ACL) repair  Stiffness of knee joint, left  Decreased strength involving knee joint      Subjective Assessment - 11/09/14 0939    Symptoms Mr. Thomas Heath has a basketball injury late October tearing his ACL.  He tried conservative treatment but ultimately ended up needing a repair which occured on 11/02/2014.  He has only been putting a little pressure on it without the brace.     How long can you sit comfortably? Pt keeps his leg straight able to sit for two hours    How long can you stand comfortably? Pt has not really tried to stand on his own he has the crutches.  With crutches he is able to stand for two hours.    How long can you walk comfortably? With crutches and immobilizer  walked for ten minutes straight    Currently in Pain? Yes   Pain Score 6    Pain Location Knee   Pain Orientation Left   Pain Descriptors / Indicators Aching   Pain Onset 1 to 4 weeks ago   Aggravating Factors  activity   Pain Relieving Factors ice          [facility]  Assessment - 11/09/14 0932    Assessment   Medical Diagnosis Lt ACL repair   Onset Date 11/02/14   Next MD Visit 11/12/2014   Prior Therapy none   Precautions   Precautions Other (comment)  knee extension (no wt)    Required Braces or Orthoses Other Brace/Splint   Restrictions   Weight Bearing Restrictions No   Other Position/Activity Restrictions with brace on    Balance Screen   Has the patient fallen in the past 6 months No   Has the patient had a decrease in activity level because of a fear of falling?  No   Is the patient reluctant to leave their home because of a fear of falling?  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, cut wires    Leisure baketball; play video games    Cognition   Overall Cognitive Status Within Functional Limits for tasks assessed   Observation/Other Assessments   Focus on Therapeutic Outcomes (FOTO)  56   AROM   Left Knee Extension 0   Left Knee Flexion 65   Strength   Left Hip Flexion 2+/5   Left Hip Extension 4/5   Left Hip ABduction 5/5   Left Knee  Flexion 3+/5   Left Knee Extension --  NT   Balance   Balance Assessed --  SLS with brace LT           [facility] Adult PT Treatment/Exercise - 11/09/14 0001    Exercises   Exercises Knee/Hip   Knee/Hip Exercises: Supine   Quad Sets 10 reps   Heel Slides 10 reps   Straight Leg Raises 10 reps   Straight Leg Raises Limitations knee bent to 30 degree   Knee/Hip Exercises: Sidelying   Hip ABduction 10 reps   Hip ADduction 10 reps   Knee/Hip Exercises: Prone   Hamstring Curl 10 reps   Hip Extension 10 reps            PT Short Term Goals - 11/09/14 1016    PT SHORT TERM GOAL #3   Title ROM to 120  degress to be able to sit with leg bend to be able to go out to eat          PT Long Term Goals - 11/09/14 1016    PT LONG TERM GOAL #1   Title Pt I in advance HEP6    Period Weeks   PT LONG TERM GOAL #2   Title ROM to 135 to be able to squat   Time 6   Period Weeks   PT LONG TERM GOAL #3   Title strength 5/5 to allow pt to go back to work   Time 6   Period Weeks   PT LONG TERM GOAL #4   Title Pt to pain to be no greater than a 2/10 80% of the day   Time 6   Period Weeks          Plan - 11/09/14 1007    Clinical Impression Statement Pt is a 23 yo who had a Lt ACL repair on 11/02/2014 and is now being referred for physical therapy.  Examination reveals swellin, decreased ROM, decreased strength and decrease balance.   Mr. Thomas Heath will benefit from skilled PT to address these issues and return him to his prior functional state.    Pt will benefit from skilled therapeutic intervention in order to improve on the following deficits Decreased activity tolerance;Decreased balance;Decreased range of motion;Decreased strength;Increased edema   Rehab Potential Good   PT Frequency 3x / week   PT Duration 6 weeks   PT Treatment/Interventions Therapeutic exercise;Therapeutic activities;Functional mobility training;Stair training;Gait training;Patient/family education;Passive range of motion;Scar mobilization;Manual techniques   PT Next Visit Plan begin SLS, standing jbee fkexion, sitting heel slide to progress knee flexion, gentle PROIM to promote knee flextion, on 12/21  may add bike, stool scoots, double leg baps progressing to single leg , TM forward and backware (progress to lateral step up when pt is able to do a 1/4 single leg squat);standing knee flexion add wt as able,    PT Home Exercise Plan given               Problem List Patient Active Problem List   Diagnosis Date Noted  . Medial meniscus, posterior horn derangement 10/15/2014  . ACL tear 09/24/2014  . DERANGEMENT MENISCUS 08/10/2008  . CRUCIATE SPRAIN 08/10/2008  No carrier recorded Donnamae JudeCindy Sharona Rovner PT/CLT [phone]  11/09/2014, 2:11 PM

## 2014-11-10 ENCOUNTER — Encounter (HOSPITAL_COMMUNITY): Payer: Self-pay | Admitting: Orthopedic Surgery

## 2014-11-12 ENCOUNTER — Encounter: Payer: Self-pay | Admitting: Orthopedic Surgery

## 2014-11-12 ENCOUNTER — Ambulatory Visit (INDEPENDENT_AMBULATORY_CARE_PROVIDER_SITE_OTHER): Payer: Self-pay | Admitting: Orthopedic Surgery

## 2014-11-12 VITALS — Ht 67.0 in | Wt 203.0 lb

## 2014-11-12 DIAGNOSIS — M23322 Other meniscus derangements, posterior horn of medial meniscus, left knee: Secondary | ICD-10-CM

## 2014-11-12 DIAGNOSIS — S83512D Sprain of anterior cruciate ligament of left knee, subsequent encounter: Secondary | ICD-10-CM

## 2014-11-12 MED ORDER — HYDROCODONE-ACETAMINOPHEN 7.5-325 MG PO TABS: 1.0000 | ORAL_TABLET | ORAL | Status: DC | PRN

## 2014-11-12 MED ORDER — IBUPROFEN 800 MG PO TABS: 800.0000 mg | ORAL_TABLET | Freq: Three times a day (TID) | ORAL | Status: DC | PRN

## 2014-11-12 NOTE — Progress Notes (Signed)
Patient ID: Thomas Heath, male   DOB: 03/29/1991, 23 y.o.   MRN: [id number] Chief Complaint  Patient presents with  . Follow-up    post op 2, Left ACL, DOS 10/30/14    His incisions are clean dry and intact he has a mild joint effusion is walking in his brace he is undergoing therapy follow-up 6 weeks x-ray postop anterior cruciate ligament AP and lateral

## 2014-11-12 NOTE — Patient Instructions (Signed)
Brace 4 more weeks  Continue therapy

## 2014-11-13 ENCOUNTER — Ambulatory Visit (HOSPITAL_COMMUNITY)
Admission: RE | Admit: 2014-11-13 | Discharge: 2014-11-13 | Disposition: A | Discharge status: Home / Self Care | Payer: BC Managed Care – PPO | Source: Ambulatory Visit | Attending: Orthopedic Surgery | Admitting: Orthopedic Surgery

## 2014-11-13 DIAGNOSIS — M25662 Stiffness of left knee, not elsewhere classified: Secondary | ICD-10-CM

## 2014-11-13 DIAGNOSIS — R29898 Other symptoms and signs involving the musculoskeletal system: Secondary | ICD-10-CM

## 2014-11-13 DIAGNOSIS — S83512D Sprain of anterior cruciate ligament of left knee, subsequent encounter: Secondary | ICD-10-CM | POA: Diagnosis not present

## 2014-11-13 DIAGNOSIS — Z4789 Encounter for other orthopedic aftercare: Secondary | ICD-10-CM

## 2014-11-13 NOTE — Therapy (Signed)
[facility] [address] [place], [place], [address] Phone: [phone]   Fax:  [phone]  Physical Therapy Treatment  Patient Details  Name: Thomas Heath MRN: [id number] Date of Birth: 05/15/1991  Encounter Date: 11/13/2014      PT End of Session - 11/13/14 1012    Visit Number 2   Number of Visits 18   Date for PT Re-Evaluation 12/09/14   Authorization Type BCBS   PT Start Time 0930   PT Stop Time 1011   PT Time Calculation (min) 41 min   Activity Tolerance Patient tolerated treatment well   Behavior During Therapy [facility] for tasks assessed/performed      Past Medical History  Diagnosis Date  . Asthma     Past Surgical History  Procedure Laterality Date  . Anterior cruciate ligament repair Left   . Anterior cruciate ligament repair Left 10/30/2014    Procedure: RECONSTRUCTION ANTERIOR CRUCIATE LIGAMENT (ACL) LEFT, PATELLER- TENDON AUTOGRAFT, MEDIAL MENISECTOMY;  Surgeon: Vickki HearingStanley E Harrison, MD;  Location: [facility];  Service: Orthopedics;  Laterality: Left;    There were no vitals taken for this visit.  Visit Diagnosis:  No diagnosis found.      Subjective Assessment - 11/13/14 1017    Symptoms No complaints of pain today, only swelling.  Pt reports staples removed yesterday and some bleeding noted on steristrips.    Currently in Pain? No/denies          [facility] - 11/13/14 0935    Assessment   Medical Diagnosis Lt ACL repair   Onset Date 11/02/14   Next MD Visit 12/24/14   AROM   Left Knee Flexion 95                  [facility] Adult PT Treatment/Exercise - 11/13/14 0931    Exercises   Exercises Knee/Hip   Knee/Hip Exercises: Stretches   Passive Hamstring Stretch 3 reps;30 seconds   Passive Hamstring Stretch Limitations Supine with rope   Gastroc Stretch 3 reps;30 seconds   Gastroc Stretch Limitations Supine with rope   Knee/Hip Exercises: Standing   Heel Raises 20 reps   Heel Raises  Limitations brace locked   Knee Flexion 10 reps;Left   SLS with Vectors Tandem Balance Lt 30" x2 on Airex, fingertip assist as need with brace on   Knee/Hip Exercises: Supine   Quad Sets 20 reps;Left   Quad Sets Limitations 5" hold, x10 neutral, x10 ER   Heel Slides 20 reps   Straight Leg Raises 2 sets;10 reps;Left   Knee/Hip Exercises: Sidelying   Hip ABduction 20 reps;Left   Hip ADduction 20 reps;Left   Knee/Hip Exercises: Prone   Hip Extension 20 reps;Left   Other Prone Exercises [name] Kick                  PT Short Term Goals - 11/13/14 1016    PT SHORT TERM GOAL #1   Title I HEP    Status On-going   PT SHORT TERM GOAL #2   Title Able to walk without crutch and immobilizer   Baseline 11/03/14 : Pt presents to clinic without cruch, with knee immobilizer locked in extension.    Status On-going   PT SHORT TERM GOAL #3   Title ROM to 120  degress to be able to sit with leg bend to be able to go out to eat   Baseline 11/03/14: knee flexion 95   Status On-going  PT SHORT TERM GOAL #4   Title Pain no greater than a 5/10    Status On-going                  Plan - 11/13/14 1012    Clinical Impression Statement PT POC initated for ACL program s/p 1 1/2 week post op.  Pt does not reports complaints of pain today, though reports yesterday his staples were removed and some swelling is present in the knee today.   Pt reports some bleeding under steristrisp and clear bandage; will continue to assess next week when pt is allowed to shower when bandage can be removed as pt was not told when to remove it.  Pt was able to complete exercise program today without pain, though muscle fatigue noted.  Noted great improvements in ROM to knee flexion 95 degrees today, with tightness at the top of movement.  Pt was also able to complpete SLR exercise today without extensor lag, though VC was required to maintain a strong quad set during exercise.  Educated pt on use of ice at home as  needed after treatment session for pain/swelling.     Pt will benefit from skilled therapeutic intervention in order to improve on the following deficits Decreased activity tolerance;Decreased balance;Decreased range of motion;Decreased strength;Increased edema   Rehab Potential Good   PT Frequency 3x / week   PT Duration 6 weeks   PT Treatment/Interventions Therapeutic exercise;Therapeutic activities;Functional mobility training;Stair training;Gait training;Patient/family education;Passive range of motion;Scar mobilization;Manual techniques   PT Next Visit Plan Continue per ACL protocol as strengthening progresses, can unlock brace for standing exercises. on 12/21  may add SLS, bike, stool scoots, double leg baps progressing to single leg per [place].         Problem List Patient Active Problem List   Diagnosis Date Noted  . Medial meniscus, posterior horn derangement 10/15/2014  . ACL tear 09/24/2014  . DERANGEMENT MENISCUS 08/10/2008  . CRUCIATE SPRAIN 08/10/2008   Kellie ShropshireStephanie Sorah Falkenstein, DPT [phone]  [facility] [address] [place], [place], [address] Phone: [phone]   Fax:  [phone]

## 2014-11-16 ENCOUNTER — Ambulatory Visit (HOSPITAL_COMMUNITY)
Admission: RE | Admit: 2014-11-16 | Discharge: 2014-11-16 | Disposition: A | Discharge status: Home / Self Care | Payer: BC Managed Care – PPO | Source: Ambulatory Visit | Attending: Orthopedic Surgery | Admitting: Orthopedic Surgery

## 2014-11-16 ENCOUNTER — Encounter: Payer: Self-pay | Admitting: Orthopedic Surgery

## 2014-11-16 DIAGNOSIS — M25662 Stiffness of left knee, not elsewhere classified: Secondary | ICD-10-CM

## 2014-11-16 DIAGNOSIS — Z4789 Encounter for other orthopedic aftercare: Secondary | ICD-10-CM

## 2014-11-16 DIAGNOSIS — R29898 Other symptoms and signs involving the musculoskeletal system: Secondary | ICD-10-CM

## 2014-11-16 DIAGNOSIS — S83512D Sprain of anterior cruciate ligament of left knee, subsequent encounter: Secondary | ICD-10-CM | POA: Diagnosis not present

## 2014-11-16 NOTE — Therapy (Signed)
[place], [place], [address] Phone: [phone]   Fax:  [phone]  Physical Therapy Treatment  Patient Details  Name: Thomas Heath MRN: [id number] Date of Birth: 02-24-1991  Encounter Date: 11/16/2014      PT End of Session - 11/16/14 1216    Visit Number 3   Number of Visits 18   Date for PT Re-Evaluation 12/09/14   Authorization Type BCBS   PT Start Time 9767   PT Stop Time 3419   PT Time Calculation (min) 50 min   Activity Tolerance Patient tolerated treatment well      Past Medical History  Diagnosis Date  . Asthma     Past Surgical History  Procedure Laterality Date  . Anterior cruciate ligament repair Left   . Anterior cruciate ligament repair Left 10/30/2014    Procedure: RECONSTRUCTION ANTERIOR CRUCIATE LIGAMENT (ACL) LEFT, PATELLER- TENDON AUTOGRAFT, MEDIAL MENISECTOMY;  Surgeon: Carole Civil, MD;  Location: [facility];  Service: Orthopedics;  Laterality: Left;    There were no vitals taken for this visit.  Visit Diagnosis:  Aftercare for anterior cruciate ligament (ACL) repair  Stiffness of knee joint, left  Decreased strength involving knee joint      Subjective Assessment - 11/16/14 1154    Symptoms Pt states that he has been doing his exercises at home.  He is ambulating without any crutcehes.   Currently in Pain? Yes   Pain Score 5    Pain Location Knee   Pain Orientation Left   Pain Descriptors / Indicators Aching                    [facility] Adult PT Treatment/Exercise - 11/16/14 0001    Knee/Hip Exercises: Stretches   Passive Hamstring Stretch 3 reps;30 seconds   Passive Hamstring Stretch Limitations on 14" step    Gastroc Stretch 3 reps;30 seconds   Gastroc Stretch Limitations slant board    Knee/Hip Exercises: Standing   Heel Raises 10 reps   Heel Raises Limitations with squat and yellow ball   Functional Squat 10 reps   Rocker Board 2 minutes   SLS  60"   SLS with Vectors vector stance x 10" x5    Knee/Hip Exercises: Supine   Quad Sets 10 reps   Heel Slides 10 reps   Straight Leg Raises Left;10 reps   Straight Leg Raises Limitations 4#   Knee/Hip Exercises: Prone   Hamstring Curl 10 reps   Hamstring Curl Limitations 4#   Hip Extension Left;10 reps   Hip Extension Limitations 4#   Other Prone Exercises Donkey kick x10                  PT Short Term Goals - 11/16/14 1229    PT SHORT TERM GOAL #1   Title I HEP    Time 1   Period Weeks   Status Achieved   PT SHORT TERM GOAL #2   Title Able to walk without crutch and immobilizer   Baseline therpist has now instructed pt to walk with immobilizer unlocked 11/16/2014   Period Weeks   Status Partially Met   PT SHORT TERM GOAL #3   Title ROM to 120  degress to be able to sit with leg bend to be able to go out to eat   Baseline 11/03/14: knee flexion 95   Time 3   Period Weeks   Status On-going   PT SHORT  TERM GOAL #4   Title Pain no greater than a 5/10    Time 3   Period Weeks           PT Long Term Goals - 11/09/14 1016    PT LONG TERM GOAL #1   Title Pt I in advance HEP6   Period Weeks   PT LONG TERM GOAL #2   Title ROM to 135 to be able to squat   Time 6   Period Weeks   PT LONG TERM GOAL #3   Title strength 5/5 to allow pt to go back to work   Time 6   Period Weeks   PT LONG TERM GOAL #4   Title Pt to pain to be no greater than a 2/10 80% of the day   Time 6   Period Weeks               Plan - 11/16/14 1234    Clinical Impression Statement PT is 2 weeks post ACL.  Pt able to add allnew exercises with good form.  PT current ROM 0-112. Pt is able to demonstrate SLR with 4# with no extension lag instructed pt to ambultate with brace unlocked.   Pt will benefit from skilled therapeutic intervention in order to improve on the following deficits Decreased activity tolerance;Decreased balance;Decreased range of motion;Decreased  strength;Increased edema   Rehab Potential Good   PT Next Visit Plan Add stool scoot and progress to Single leg baps         Problem List Patient Active Problem List   Diagnosis Date Noted  . Medial meniscus, posterior horn derangement 10/15/2014  . ACL tear 09/24/2014  . DERANGEMENT MENISCUS 08/10/2008  . CRUCIATE SPRAIN 08/10/2008  Azucena Freed PT/CLT [phone]  11/16/2014, 12:36 PM  [facility] [address] [place], [place], [address] Phone: [phone]   Fax:  [phone]

## 2014-11-18 ENCOUNTER — Ambulatory Visit (HOSPITAL_COMMUNITY): Payer: BC Managed Care – PPO | Admitting: Physical Therapy

## 2014-11-19 ENCOUNTER — Ambulatory Visit (HOSPITAL_COMMUNITY)
Admission: RE | Admit: 2014-11-19 | Discharge: 2014-11-19 | Disposition: A | Discharge status: Home / Self Care | Payer: BC Managed Care – PPO | Source: Ambulatory Visit | Attending: Orthopedic Surgery | Admitting: Orthopedic Surgery

## 2014-11-19 DIAGNOSIS — R29898 Other symptoms and signs involving the musculoskeletal system: Secondary | ICD-10-CM

## 2014-11-19 DIAGNOSIS — S83512D Sprain of anterior cruciate ligament of left knee, subsequent encounter: Secondary | ICD-10-CM | POA: Diagnosis not present

## 2014-11-19 DIAGNOSIS — M25662 Stiffness of left knee, not elsewhere classified: Secondary | ICD-10-CM

## 2014-11-19 DIAGNOSIS — Z4789 Encounter for other orthopedic aftercare: Secondary | ICD-10-CM

## 2014-11-19 NOTE — Therapy (Signed)
[place], [place], [address] Phone: [phone]   Fax:  [phone]  Physical Therapy Treatment  Patient Details  Name: Thomas Heath MRN: [id number] Date of Birth: [birth year]  Encounter Date: 11/19/2014      PT End of Session - 11/19/14 0913    Visit Number 4   Number of Visits 18   Authorization Type BCBS   PT Start Time 2563   PT Stop Time 0925   PT Time Calculation (min) 47 min      Past Medical History  Diagnosis Date  . Asthma     Past Surgical History  Procedure Laterality Date  . Anterior cruciate ligament repair Left   . Anterior cruciate ligament repair Left 10/30/2014    Procedure: RECONSTRUCTION ANTERIOR CRUCIATE LIGAMENT (ACL) LEFT, PATELLER- TENDON AUTOGRAFT, MEDIAL MENISECTOMY;  Surgeon: Carole Civil, MD;  Location: [facility];  Service: Orthopedics;  Laterality: Left;    There were no vitals taken for this visit.  Visit Diagnosis:  Aftercare for anterior cruciate ligament (ACL) repair  Stiffness of knee joint, left  Decreased strength involving knee joint      Subjective Assessment - 11/19/14 0841    Symptoms Pt states he is doing welll no pain   Currently in Pain? No/denies   Pain Score 0-No pain                    [facility] Adult PT Treatment/Exercise - 11/19/14 0001    Knee/Hip Exercises: Stretches   Gastroc Stretch 1 rep;60 seconds   Knee/Hip Exercises: Aerobic   Tread Mill retro 1.0 MPH x 6/   Knee/Hip Exercises: Standing   Heel Raises 15 reps  single leg LT    Forward Step Up Left;10 reps   Functional Squat 10 reps  single leg mini sqat; when pt can do 1/4 squat may do latera   Rocker Board 2 minutes   SLS with Vectors vector stance x 10" x 3   Knee/Hip Exercises: Supine   Heel Slides 10 reps   Straight Leg Raises Strengthening;Left;15 reps   Straight Leg Raises Limitations 5#   Knee/Hip Exercises: Prone   Hamstring Curl 15 reps   Hamstring Curl  Limitations 5#   Hip Extension Strengthening;Left;15 reps   Hip Extension Limitations 5#                  PT Short Term Goals - 11/16/14 1229    PT SHORT TERM GOAL #1   Title I HEP    Time 1   Period Weeks   Status Achieved   PT SHORT TERM GOAL #2   Title Able to walk without crutch and immobilizer   Baseline therpist has now instructed pt to walk with immobilizer unlocked 11/16/2014   Period Weeks   Status Partially Met   PT SHORT TERM GOAL #3   Title ROM to 120  degress to be able to sit with leg bend to be able to go out to eat   Baseline 11/03/14: knee flexion 95   Time 3   Period Weeks   Status On-going   PT SHORT TERM GOAL #4   Title Pain no greater than a 5/10    Time 3   Period Weeks           PT Long Term Goals - 11/09/14 1016    PT LONG TERM GOAL #1   Title Pt I in advance HEP6  Period Weeks   PT LONG TERM GOAL #2   Title ROM to 135 to be able to squat   Time 6   Period Weeks   PT LONG TERM GOAL #3   Title strength 5/5 to allow pt to go back to work   Time 6   Period Weeks   PT LONG TERM GOAL #4   Title Pt to pain to be no greater than a 2/10 80% of the day   Time 6   Period Weeks               Plan - 11/19/14 0913    Clinical Impression Statement Pt is progressing well.  Unable to complete a 1/4 single squat without increaed pain but was able to bend slightly.  Pt ROM at this time 0 to 120.  Able to increase to 5# with SLR   PT Next Visit Plan begin lateral step ups if pt is able to complete 1/4 single leg squat without pain.         Problem List Patient Active Problem List   Diagnosis Date Noted  . Medial meniscus, posterior horn derangement 10/15/2014  . ACL tear 09/24/2014  . DERANGEMENT MENISCUS 08/10/2008  . CRUCIATE SPRAIN 08/10/2008    RUSSELL,CINDY PT 11/19/2014, 9:26 AM  [facility] [address] [place], [place], [address] Phone: [phone]   Fax:   [phone]

## 2014-11-23 ENCOUNTER — Ambulatory Visit (HOSPITAL_COMMUNITY)
Admission: RE | Admit: 2014-11-23 | Discharge: 2014-11-23 | Disposition: A | Discharge status: Home / Self Care | Payer: BC Managed Care – PPO | Source: Ambulatory Visit | Attending: Family Medicine | Admitting: Family Medicine

## 2014-11-23 DIAGNOSIS — M25662 Stiffness of left knee, not elsewhere classified: Secondary | ICD-10-CM

## 2014-11-23 DIAGNOSIS — S83512D Sprain of anterior cruciate ligament of left knee, subsequent encounter: Secondary | ICD-10-CM | POA: Diagnosis not present

## 2014-11-23 DIAGNOSIS — R29898 Other symptoms and signs involving the musculoskeletal system: Secondary | ICD-10-CM

## 2014-11-23 DIAGNOSIS — Z4789 Encounter for other orthopedic aftercare: Secondary | ICD-10-CM

## 2014-11-23 NOTE — Therapy (Signed)
[place], [place], [address] Phone: [phone]   Fax:  [phone]  Physical Therapy Treatment  Patient Details  Name: Thomas Heath MRN: [id number] Date of Birth: 1991-04-30  Encounter Date: 11/23/2014      PT End of Session - 11/23/14 1242    Visit Number 5   Number of Visits 18   Date for PT Re-Evaluation 12/09/14   Authorization Type BCBS   PT Start Time 1148   PT Stop Time 1243   PT Time Calculation (min) 55 min   Activity Tolerance Patient tolerated treatment well      Past Medical History  Diagnosis Date  . Asthma     Past Surgical History  Procedure Laterality Date  . Anterior cruciate ligament repair Left   . Anterior cruciate ligament repair Left 10/30/2014    Procedure: RECONSTRUCTION ANTERIOR CRUCIATE LIGAMENT (ACL) LEFT, PATELLER- TENDON AUTOGRAFT, MEDIAL MENISECTOMY;  Surgeon: Carole Civil, MD;  Location: [facility];  Service: Orthopedics;  Laterality: Left;    There were no vitals taken for this visit.  Visit Diagnosis:  Aftercare for anterior cruciate ligament (ACL) repair  Stiffness of knee joint, left  Decreased strength involving knee joint      Subjective Assessment - 11/23/14 1151    Symptoms Pt states pain is at a 1;  completing exercises at home    Currently in Pain? Yes   Pain Score 1                     [facility] Adult PT Treatment/Exercise - 11/23/14 0001    Exercises   Exercises Knee/Hip   Knee/Hip Exercises: Stretches   Passive Hamstring Stretch 3 reps;30 seconds   Gastroc Stretch 1 rep;60 seconds   Knee/Hip Exercises: Aerobic   Tread Mill retro 2.0 mph x 10'   Knee/Hip Exercises: Standing   Heel Raises 15 reps  single leg LT x10 as well   Heel Raises Limitations squat yellow ball on 6" step   Forward Step Up Left;15 reps;Step Height: 6"   Functional Squat 15 reps  then again with Lt leg only 1/4 x 33m   Rocker Board 2 minutes   SLS with  Vectors vector stance x 10" x 3   Other Standing Knee Exercises BAPS Lt only L3 x 10    Knee/Hip Exercises: Supine   Quad Sets 10 reps   Heel Slides 10 reps   Straight Leg Raises Strengthening;Left;15 reps   Straight Leg Raises Limitations 5#   Knee/Hip Exercises: Sidelying   Hip ABduction Strengthening;Left;10 reps   Hip ABduction Limitations 5#   Hip ADduction Strengthening;Left;15 reps   Hip ADduction Limitations 5#   Knee/Hip Exercises: Prone   Hamstring Curl 15 reps   Hamstring Curl Limitations 5#   Hip Extension Strengthening;Left;15 reps   Hip Extension Limitations 5#                  PT Short Term Goals - 11/16/14 1229    PT SHORT TERM GOAL #1   Title I HEP    Time 1   Period Weeks   Status Achieved   PT SHORT TERM GOAL #2   Title Able to walk without crutch and immobilizer   Baseline therpist has now instructed pt to walk with immobilizer unlocked 11/16/2014   Period Weeks   Status Partially Met   PT SHORT TERM GOAL #3   Title ROM to 120  degress  to be able to sit with leg bend to be able to go out to eat   Baseline 11/03/14: knee flexion 95   Time 3   Period Weeks   Status On-going   PT SHORT TERM GOAL #4   Title Pain no greater than a 5/10    Time 3   Period Weeks           PT Long Term Goals - 11/09/14 1016    PT LONG TERM GOAL #1   Title Pt I in advance HEP6   Period Weeks   PT LONG TERM GOAL #2   Title ROM to 135 to be able to squat   Time 6   Period Weeks   PT LONG TERM GOAL #3   Title strength 5/5 to allow pt to go back to work   Time 6   Period Weeks   PT LONG TERM GOAL #4   Title Pt to pain to be no greater than a 2/10 80% of the day   Time 6   Period Weeks               Plan - 11/23/14 1242    Clinical Impression Statement PT treatment session completed without brace on.  Pt improved in 1/4 squat but fatigues quickly therefore lateral step ups were not added to program.  ROM improved to 124    PT Next Visit Plan  begin lateral step ups if pt is able to complete 1/4 single leg squat without pain.         Problem List Patient Active Problem List   Diagnosis Date Noted  . Medial meniscus, posterior horn derangement 10/15/2014  . ACL tear 09/24/2014  . DERANGEMENT MENISCUS 08/10/2008  . CRUCIATE SPRAIN 08/10/2008    Adley Castello,CINDY PT  11/23/2014, 12:45 PM  [facility] [address] [place], [place], [address] Phone: [phone]   Fax:  [phone]

## 2014-11-25 ENCOUNTER — Ambulatory Visit (HOSPITAL_COMMUNITY)
Admission: RE | Admit: 2014-11-25 | Discharge: 2014-11-25 | Disposition: A | Discharge status: Home / Self Care | Payer: BC Managed Care – PPO | Source: Ambulatory Visit | Attending: Orthopedic Surgery | Admitting: Orthopedic Surgery

## 2014-11-25 ENCOUNTER — Other Ambulatory Visit: Payer: Self-pay | Admitting: *Deleted

## 2014-11-25 ENCOUNTER — Telehealth: Payer: Self-pay | Admitting: Orthopedic Surgery

## 2014-11-25 DIAGNOSIS — S83512D Sprain of anterior cruciate ligament of left knee, subsequent encounter: Secondary | ICD-10-CM | POA: Diagnosis not present

## 2014-11-25 DIAGNOSIS — Z4789 Encounter for other orthopedic aftercare: Secondary | ICD-10-CM

## 2014-11-25 DIAGNOSIS — M25662 Stiffness of left knee, not elsewhere classified: Secondary | ICD-10-CM

## 2014-11-25 DIAGNOSIS — R29898 Other symptoms and signs involving the musculoskeletal system: Secondary | ICD-10-CM

## 2014-11-25 MED ORDER — PROMETHAZINE HCL 12.5 MG PO TABS: 12.5000 mg | ORAL_TABLET | Freq: Four times a day (QID) | ORAL | Status: DC | PRN

## 2014-11-25 MED ORDER — HYDROCODONE-ACETAMINOPHEN 7.5-325 MG PO TABS: 1.0000 | ORAL_TABLET | ORAL | Status: DC | PRN

## 2014-11-25 NOTE — Therapy (Signed)
[place], [place], [address] Phone: [phone]   Fax:  [phone]  Physical Therapy Treatment  Patient Details  Name: Thomas Heath MRN: [id number] Date of Birth: 04/26/1991  Encounter Date: 11/25/2014      PT End of Session - 11/25/14 0928    Visit Number 6   Number of Visits 18   Date for PT Re-Evaluation 12/09/14   Authorization Type BCBS   PT Start Time 0850   PT Stop Time 0945   PT Time Calculation (min) 55 min   Activity Tolerance Patient tolerated treatment well      Past Medical History  Diagnosis Date  . Asthma     Past Surgical History  Procedure Laterality Date  . Anterior cruciate ligament repair Left   . Anterior cruciate ligament repair Left 10/30/2014    Procedure: RECONSTRUCTION ANTERIOR CRUCIATE LIGAMENT (ACL) LEFT, PATELLER- TENDON AUTOGRAFT, MEDIAL MENISECTOMY;  Surgeon: Carole Civil, MD;  Location: [facility];  Service: Orthopedics;  Laterality: Left;    There were no vitals taken for this visit.  Visit Diagnosis:  Aftercare for anterior cruciate ligament (ACL) repair  Stiffness of knee joint, left  Decreased strength involving knee joint      Subjective Assessment - 11/25/14 0854    Symptoms Pt states pain is at a 1;  completing exercises at home    Currently in Pain? Yes   Pain Score 1    Pain Location Knee   Pain Orientation Left   Pain Descriptors / Indicators Aching               [facility] Adult PT Treatment/Exercise - 11/25/14 0001    Exercises   Exercises Knee/Hip   Knee/Hip Exercises: Stretches   Passive Hamstring Stretch 3 reps;30 seconds   Passive Hamstring Stretch Limitations HEP   Gastroc Stretch 1 rep;60 seconds   Gastroc Stretch Limitations HEP   Knee/Hip Exercises: Aerobic   Stationary Bike L6 x 15:00   Tread Mill --   Knee/Hip Exercises: Standing   Heel Raises 15 reps  single leg LT x10 as well Simultaneous filing. User may not have seen  previous data.   Heel Raises Limitations squat yellow ball on 6" step  Simultaneous filing. User may not have seen previous data.   Lateral Step Up Left;10 reps;Step Height: 4"   Forward Step Up Left;15 reps;Step Height: 6"   Functional Squat 15 reps  then again with Lt leg only 1/4 x 81m   Rocker Board 2 minutes  Simultaneous filing. User may not have seen previous data.   Rocker Board Limitations forward and back, side to side   SLS with Vectors vector stance x 10" x 3  on foam   Other Standing Knee Exercises BAPS Lt only L4 x 10    Knee/Hip Exercises: Seated   Other Seated Knee Exercises stool scoot with 15# added to stool x 2RT   Knee/Hip Exercises: Supine   Quad Sets 10 reps   Heel Slides 10 reps   Straight Leg Raises Strengthening;Left;15 reps   Straight Leg Raises Limitations 71/2#   Knee/Hip Exercises: Sidelying   Hip ABduction Strengthening;Left;10 reps   Hip ABduction Limitations 71/2#   Hip ADduction Strengthening;Left;15 reps   Hip ADduction Limitations 71/2#   Knee/Hip Exercises: Prone   Hamstring Curl 15 reps   Hamstring Curl Limitations 71/2#   Hip Extension Strengthening;Left;15 reps   Hip Extension Limitations 71/2#  PT Short Term Goals - 11/16/14 1229    PT SHORT TERM GOAL #1   Title I HEP    Time 1   Period Weeks   Status Achieved   PT SHORT TERM GOAL #2   Title Able to walk without crutch and immobilizer   Baseline therpist has now instructed pt to walk with immobilizer unlocked 11/16/2014   Period Weeks   Status Partially Met   PT SHORT TERM GOAL #3   Title ROM to 120  degress to be able to sit with leg bend to be able to go out to eat   Baseline 11/03/14: knee flexion 95   Time 3   Period Weeks   Status On-going   PT SHORT TERM GOAL #4   Title Pain no greater than a 5/10    Time 3   Period Weeks           PT Long Term Goals - 11/09/14 1016    PT LONG TERM GOAL #1   Title Pt I in advance HEP6   Period Weeks   PT LONG  TERM GOAL #2   Title ROM to 135 to be able to squat   Time 6   Period Weeks   PT LONG TERM GOAL #3   Title strength 5/5 to allow pt to go back to work   Time 6   Period Weeks   PT LONG TERM GOAL #4   Title Pt to pain to be no greater than a 2/10 80% of the day   Time 6   Period Weeks            Plan - 11/25/14 6720    Clinical Impression Statement Pt able to progress to lateral step ups as well as increasing wt to 71/2 #.  Pt ROM continues to improve to    PT Next Visit Plan begin isokinetic fro 90 to 40 degrees at 180-150-120         Problem List Patient Active Problem List   Diagnosis Date Noted  . Medial meniscus, posterior horn derangement 10/15/2014  . ACL tear 09/24/2014  . DERANGEMENT MENISCUS 08/10/2008  . CRUCIATE SPRAIN 08/10/2008    Arneshia Ade,CINDY PT 11/25/2014, 10:07 AM  [facility] [address] [place], [place], [address] Phone: [phone]   Fax:  [phone]

## 2014-11-25 NOTE — Telephone Encounter (Signed)
Patient is calling asking for a refill on all medications HYDROcodone-acetaminophen (NORCO) 7.5-325 MG per tablet [[id number]][[id number]] promethazine (PHENERGAN) 12.5 MG tablet ibuprofen (ADVIL,MOTRIN) 800 MG tablet , please advise?

## 2014-11-26 NOTE — Telephone Encounter (Signed)
Prescription is ready for pick up and patient is aware 

## 2014-11-26 NOTE — Telephone Encounter (Signed)
Patient picked up both prescriptions.

## 2014-11-30 ENCOUNTER — Ambulatory Visit (HOSPITAL_COMMUNITY)
Admission: RE | Admit: 2014-11-30 | Discharge: 2014-11-30 | Disposition: A | Discharge status: Home / Self Care | Payer: BLUE CROSS/BLUE SHIELD | Source: Ambulatory Visit | Attending: Family Medicine | Admitting: Family Medicine

## 2014-11-30 DIAGNOSIS — M25662 Stiffness of left knee, not elsewhere classified: Secondary | ICD-10-CM | POA: Insufficient documentation

## 2014-11-30 DIAGNOSIS — S83512D Sprain of anterior cruciate ligament of left knee, subsequent encounter: Secondary | ICD-10-CM | POA: Insufficient documentation

## 2014-11-30 DIAGNOSIS — M25562 Pain in left knee: Secondary | ICD-10-CM | POA: Insufficient documentation

## 2014-11-30 DIAGNOSIS — R29898 Other symptoms and signs involving the musculoskeletal system: Secondary | ICD-10-CM

## 2014-11-30 DIAGNOSIS — Z4789 Encounter for other orthopedic aftercare: Secondary | ICD-10-CM

## 2014-11-30 DIAGNOSIS — M6281 Muscle weakness (generalized): Secondary | ICD-10-CM | POA: Diagnosis not present

## 2014-11-30 NOTE — Therapy (Signed)
[facility] [address] [place], [place], [address] Phone: [phone]   Fax:  [phone]  Physical Therapy Treatment  Patient Details  Name: Thomas Heath MRN: [id number] Date of Birth: [birth year]  Encounter Date: 11/30/2014      PT End of Session - 11/30/14 0925    Visit Number 7   Number of Visits 18   Date for PT Re-Evaluation 12/09/14   Authorization Type BCBS   PT Start Time 0850   PT Stop Time 0950   PT Time Calculation (min) 60 min   Activity Tolerance Patient tolerated treatment well   Behavior During Therapy [facility] for tasks assessed/performed      Past Medical History  Diagnosis Date  . Asthma     Past Surgical History  Procedure Laterality Date  . Anterior cruciate ligament repair Left   . Anterior cruciate ligament repair Left 10/30/2014    Procedure: RECONSTRUCTION ANTERIOR CRUCIATE LIGAMENT (ACL) LEFT, PATELLER- TENDON AUTOGRAFT, MEDIAL MENISECTOMY;  Surgeon: Carole Civil, MD;  Location: [facility];  Service: Orthopedics;  Laterality: Left;    There were no vitals taken for this visit.  Visit Diagnosis:  Aftercare for anterior cruciate ligament (ACL) repair  Stiffness of knee joint, left  Decreased strength involving knee joint      Subjective Assessment - 11/30/14 0928    Symptoms Pt states he's having mild discomfort only, no real pain.  States he's been working on steps at home.    Currently in Pain? Yes   Pain Score 1    Pain Location Knee   Pain Orientation Left            [facility] Adult PT Treatment/Exercise - 11/30/14 0925    Knee/Hip Exercises: Aerobic   Stationary Bike nustep hills #4, level 4 15 minutes LE only   Knee/Hip Exercises: Standing   Heel Raises 15 reps   Heel Raises Limitations squat yellow ball on 6" step   Lateral Step Up Left;10 reps;Step Height: 6"   Forward Step Up Left;15 reps;Step Height: 6"   Step Down Left;10 reps;Hand Hold: 0;Step Height: 4"   Functional Squat  15 reps  15 reps Lt only   Rocker Board 2 minutes   Rocker Board Limitations forward and back, side to side   SLS SLS balance with reaches 10 reps off 2" box, A/P,  R/L   SLS with Vectors vector stance x 10" x 3   Other Standing Knee Exercises BAPS Lt only L4 x 10    Knee/Hip Exercises: Seated   Other Seated Knee Exercises isokinetic biodex 40-90 degrees 180-150-120 10 reps each up/down   Knee/Hip Exercises: Supine   Straight Leg Raises Strengthening;Left;15 reps   Straight Leg Raises Limitations 71/2#   Knee/Hip Exercises: Sidelying   Hip ABduction Strengthening;Left;15 reps   Hip ABduction Limitations 71/2#   Hip ADduction Strengthening;Left;15 reps   Hip ADduction Limitations 71/2#   Knee/Hip Exercises: Prone   Hamstring Curl 15 reps   Hamstring Curl Limitations 71/2#   Hip Extension Strengthening;Left;15 reps   Hip Extension Limitations 71/2#                  PT Short Term Goals - 11/16/14 1229    PT SHORT TERM GOAL #1   Title I HEP    Time 1   Period Weeks   Status Achieved   PT SHORT TERM GOAL #2   Title Able to walk without crutch and immobilizer   Baseline  therpist has now instructed pt to walk with immobilizer unlocked 11/16/2014   Period Weeks   Status Partially Met   PT SHORT TERM GOAL #3   Title ROM to 120  degress to be able to sit with leg bend to be able to go out to eat   Baseline 11/03/14: knee flexion 95   Time 3   Period Weeks   Status On-going   PT SHORT TERM GOAL #4   Title Pain no greater than a 5/10    Time 3   Period Weeks           PT Long Term Goals - 11/09/14 1016    PT LONG TERM GOAL #1   Title Pt I in advance HEP6   Period Weeks   PT LONG TERM GOAL #2   Title ROM to 135 to be able to squat   Time 6   Period Weeks   PT LONG TERM GOAL #3   Title strength 5/5 to allow pt to go back to work   Time 6   Period Weeks   PT LONG TERM GOAL #4   Title Pt to pain to be no greater than a 2/10 80% of the day   Time 6   Period  Weeks               Plan - 11/30/14 1013    PT Next Visit Plan Continue to progress per ACL protocol.  Pt 4 weeks post/op on 1/1 (surgery 12/4).  Add elliptical and progress closed chain exercises.  Progress to full ROM by week 6.        Problem List Patient Active Problem List   Diagnosis Date Noted  . Medial meniscus, posterior horn derangement 10/15/2014  . ACL tear 09/24/2014  . DERANGEMENT MENISCUS 08/10/2008  . CRUCIATE SPRAIN 08/10/2008    Teena Irani, PTA/CLT [phone] 11/30/2014, 10:14 AM  [facility] [place], [place], [address] Phone: [phone]   Fax:  [phone]

## 2014-12-02 ENCOUNTER — Ambulatory Visit (HOSPITAL_COMMUNITY)
Admission: RE | Admit: 2014-12-02 | Discharge: 2014-12-02 | Disposition: A | Discharge status: Home / Self Care | Payer: BLUE CROSS/BLUE SHIELD | Source: Ambulatory Visit | Attending: Family Medicine | Admitting: Family Medicine

## 2014-12-02 DIAGNOSIS — M25662 Stiffness of left knee, not elsewhere classified: Secondary | ICD-10-CM

## 2014-12-02 DIAGNOSIS — S83512D Sprain of anterior cruciate ligament of left knee, subsequent encounter: Secondary | ICD-10-CM | POA: Diagnosis not present

## 2014-12-02 DIAGNOSIS — R29898 Other symptoms and signs involving the musculoskeletal system: Secondary | ICD-10-CM

## 2014-12-02 DIAGNOSIS — Z4789 Encounter for other orthopedic aftercare: Secondary | ICD-10-CM

## 2014-12-02 NOTE — Therapy (Signed)
[place], [place], [address] Phone: [phone]   Fax:  [phone]  Physical Therapy Treatment  Patient Details  Name: WYNSTON ROMEY MRN: [id number] Date of Birth: [birth year]  Encounter Date: 12/02/2014      PT End of Session - 12/02/14 0932    Visit Number 8   Number of Visits 18   Date for PT Re-Evaluation 12/09/14   Authorization Type BCBS   PT Start Time 0848   PT Stop Time 0932   PT Time Calculation (min) 44 min   Activity Tolerance Patient tolerated treatment well   Behavior During Therapy [facility] for tasks assessed/performed      Past Medical History  Diagnosis Date  . Asthma     Past Surgical History  Procedure Laterality Date  . Anterior cruciate ligament repair Left   . Anterior cruciate ligament repair Left 10/30/2014    Procedure: RECONSTRUCTION ANTERIOR CRUCIATE LIGAMENT (ACL) LEFT, PATELLER- TENDON AUTOGRAFT, MEDIAL MENISECTOMY;  Surgeon: Carole Civil, MD;  Location: [facility];  Service: Orthopedics;  Laterality: Left;    There were no vitals taken for this visit.  Visit Diagnosis:  Aftercare for anterior cruciate ligament (ACL) repair  Stiffness of knee joint, left  Decreased strength involving knee joint      Subjective Assessment - 12/02/14 0909    Symptoms No pain or soreness today.   Currently in Pain? No/denies             [facility] Adult PT Treatment/Exercise - 12/02/14 0909    Knee/Hip Exercises: Aerobic   Elliptical 8 minutes level 1   Isokinetic biodex 180-150-120 10 reps each 90-40 degrees only   Knee/Hip Exercises: Standing   Heel Raises 15 reps   Heel Raises Limitations squat blue ball on 6" step   Forward Lunges Left;10 reps   Forward Lunges Limitations 6" with reach 3 way   Lateral Step Up Left;Step Height: 6";15 reps   Forward Step Up Left;15 reps;Step Height: 6"   Step Down Left;10 reps;Hand Hold: 0;Step Height: 2"   Functional Squat 2 sets;10  reps;Limitations   Functional Squat Limitations single leg only   Wall Squat Limitations --   SLS with Vectors vector stance x 5" x 10 no HHA   Other Standing Knee Exercises squat matrix 3# UE reach neutral position                  PT Short Term Goals - 11/16/14 1229    PT SHORT TERM GOAL #1   Title I HEP    Time 1   Period Weeks   Status Achieved   PT SHORT TERM GOAL #2   Title Able to walk without crutch and immobilizer   Baseline therpist has now instructed pt to walk with immobilizer unlocked 11/16/2014   Period Weeks   Status Partially Met   PT SHORT TERM GOAL #3   Title ROM to 120  degress to be able to sit with leg bend to be able to go out to eat   Baseline 11/03/14: knee flexion 95   Time 3   Period Weeks   Status On-going   PT SHORT TERM GOAL #4   Title Pain no greater than a 5/10    Time 3   Period Weeks           PT Long Term Goals - 11/09/14 1016    PT LONG TERM GOAL #1   Title Pt I in  advance HEP6   Period Weeks   PT LONG TERM GOAL #2   Title ROM to 135 to be able to squat   Time 6   Period Weeks   PT LONG TERM GOAL #3   Title strength 5/5 to allow pt to go back to work   Time 6   Period Weeks   PT LONG TERM GOAL #4   Title Pt to pain to be no greater than a 2/10 80% of the day   Time 6   Period Weeks               Plan - 12/02/14 2224    Clinical Impression Statement Progressed closed chain activites with squat matrix and lunge reach onto 6" step.  Decreased step height of forward step downs to 2" for improved control.  Noted weakness/trembling of Lt LE.  Added elliptical in lieu of stairmaster per ACL protocol.  Pt without pain completing any actvities today.   PT Next Visit Plan Continue to progress per ACL protocol.  Pt 4 weeks post/op on 1/1 (surgery 12/4).         Problem List Patient Active Problem List   Diagnosis Date Noted  . Medial meniscus, posterior horn derangement 10/15/2014  . ACL tear 09/24/2014  .  DERANGEMENT MENISCUS 08/10/2008  . CRUCIATE SPRAIN 08/10/2008    Teena Irani, PTA/CLT [phone] 12/02/2014, 9:38 AM  [facility] [address] [place], [place], [address] Phone: [phone]   Fax:  [phone]

## 2014-12-04 ENCOUNTER — Ambulatory Visit (HOSPITAL_COMMUNITY)
Admission: RE | Admit: 2014-12-04 | Payer: BLUE CROSS/BLUE SHIELD | Source: Ambulatory Visit | Attending: Orthopedic Surgery | Admitting: Orthopedic Surgery

## 2014-12-07 ENCOUNTER — Ambulatory Visit (HOSPITAL_COMMUNITY)
Admission: RE | Admit: 2014-12-07 | Discharge: 2014-12-07 | Disposition: A | Discharge status: Home / Self Care | Payer: BLUE CROSS/BLUE SHIELD | Source: Ambulatory Visit | Attending: Family Medicine | Admitting: Family Medicine

## 2014-12-07 DIAGNOSIS — Z4789 Encounter for other orthopedic aftercare: Secondary | ICD-10-CM

## 2014-12-07 DIAGNOSIS — S83512D Sprain of anterior cruciate ligament of left knee, subsequent encounter: Secondary | ICD-10-CM | POA: Diagnosis not present

## 2014-12-07 DIAGNOSIS — R29898 Other symptoms and signs involving the musculoskeletal system: Secondary | ICD-10-CM

## 2014-12-07 DIAGNOSIS — M25662 Stiffness of left knee, not elsewhere classified: Secondary | ICD-10-CM

## 2014-12-07 NOTE — Therapy (Signed)
[place], [place], [address] Phone: [phone]   Fax:  [phone]  Physical Therapy Treatment  Patient Details  Name: Thomas Heath MRN: [id number] Date of Birth: 05-24-1991 Referring Provider:  Mikey Kirschner, MD  Encounter Date: 12/07/2014      PT End of Session - 12/07/14 0924    Visit Number 9   Number of Visits 18   Date for PT Re-Evaluation 12/09/14   Authorization Type BCBS   PT Start Time 8891   PT Stop Time 0936   PT Time Calculation (min) 49 min      Past Medical History  Diagnosis Date  . Asthma     Past Surgical History  Procedure Laterality Date  . Anterior cruciate ligament repair Left   . Anterior cruciate ligament repair Left 10/30/2014    Procedure: RECONSTRUCTION ANTERIOR CRUCIATE LIGAMENT (ACL) LEFT, PATELLER- TENDON AUTOGRAFT, MEDIAL MENISECTOMY;  Surgeon: Carole Civil, MD;  Location: [facility];  Service: Orthopedics;  Laterality: Left;    There were no vitals taken for this visit.  Visit Diagnosis:  Aftercare for anterior cruciate ligament (ACL) repair  Stiffness of knee joint, left  Decreased strength involving knee joint      Subjective Assessment - 12/07/14 0849    Symptoms No pain in his knee.   Going without his brace more and more    Currently in Pain? No/denies            [facility] Adult PT Treatment/Exercise - 12/07/14 0001    Knee/Hip Exercises: Aerobic   Elliptical 10 minutes level 1   Isokinetic biodex 180-150-120 10 reps each 90-40 degrees only   Knee/Hip Exercises: Standing   Heel Raises 15 reps   Heel Raises Limitations with squat 4" step blue ball   Forward Lunges Left;15 reps   Forward Lunges Limitations 6" with reach 3 way with blue ball   Lateral Step Up Left;15 reps;Hand Hold: 0;Step Height: 8"   Forward Step Up Left;15 reps;Hand Hold: 0;Step Height: 8"   Step Down Left;10 reps;Hand Hold: 0;Step Height: 4"   Stairs 2 Round trip slow    Rocker Board 2 minutes   SLS with Vectors 30" stance x 3    Knee/Hip Exercises: Seated   Other Seated Knee Exercises isokinetic biodex 40-90 degrees 180-150-120 10 reps each up/down                  PT Short Term Goals - 11/16/14 1229    PT SHORT TERM GOAL #1   Title I HEP    Time 1   Period Weeks   Status Achieved   PT SHORT TERM GOAL #2   Title Able to walk without crutch and immobilizer   Baseline therpist has now instructed pt to walk with immobilizer unlocked 11/16/2014   Period Weeks   Status Partially Met   PT SHORT TERM GOAL #3   Title ROM to 120  degress to be able to sit with leg bend to be able to go out to eat   Baseline 11/03/14: knee flexion 95   Time 3   Period Weeks   Status On-going   PT SHORT TERM GOAL #4   Title Pain no greater than a 5/10    Time 3   Period Weeks           PT Long Term Goals - 11/09/14 1016    PT LONG TERM GOAL #1   Title Pt  I in advance HEP6   Period Weeks   PT LONG TERM GOAL #2   Title ROM to 135 to be able to squat   Time 6   Period Weeks   PT LONG TERM GOAL #3   Title strength 5/5 to allow pt to go back to work   Time 6   Period Weeks   PT LONG TERM GOAL #4   Title Pt to pain to be no greater than a 2/10 80% of the day   Time 6   Period Weeks               Plan - 12/07/14 4650    Clinical Impression Statement increased step height for lateral, forward and step downs, added steps to program.  Pt ROM taken and is at 0 to 135.  Pt is progressing well with ACL protocol   PT Next Visit Plan Continue to progress per ACL protocol. Pt currently at week 6 .        Problem List Patient Active Problem List   Diagnosis Date Noted  . Medial meniscus, posterior horn derangement 10/15/2014  . ACL tear 09/24/2014  . DERANGEMENT MENISCUS 08/10/2008  . CRUCIATE SPRAIN 08/10/2008    Mitsy Owen,CINDY PT 12/07/2014, 9:28 AM  [place],  [place], [address] Phone: [phone]   Fax:  [phone]

## 2014-12-09 ENCOUNTER — Ambulatory Visit (HOSPITAL_COMMUNITY)
Admission: RE | Admit: 2014-12-09 | Discharge: 2014-12-09 | Disposition: A | Discharge status: Home / Self Care | Payer: BLUE CROSS/BLUE SHIELD | Source: Ambulatory Visit | Attending: Family Medicine | Admitting: Family Medicine

## 2014-12-09 DIAGNOSIS — Z4789 Encounter for other orthopedic aftercare: Secondary | ICD-10-CM

## 2014-12-09 DIAGNOSIS — S83512D Sprain of anterior cruciate ligament of left knee, subsequent encounter: Secondary | ICD-10-CM | POA: Diagnosis not present

## 2014-12-09 DIAGNOSIS — R29898 Other symptoms and signs involving the musculoskeletal system: Secondary | ICD-10-CM

## 2014-12-09 DIAGNOSIS — M25662 Stiffness of left knee, not elsewhere classified: Secondary | ICD-10-CM

## 2014-12-09 NOTE — Therapy (Signed)
[place], [place], [address] Phone: [phone]   Fax:  [phone]  Physical Therapy Treatment  Patient Details  Name: MARICELA KAWAHARA MRN: [id number] Date of Birth: Mar 31, 1991 Referring Provider:  Mikey Kirschner, MD  Encounter Date: 12/09/2014      PT End of Session - 12/09/14 0907    Visit Number 10   Number of Visits 18   Date for PT Re-Evaluation 12/09/14   Authorization Type BCBS   PT Start Time 0858   PT Stop Time 0945   PT Time Calculation (min) 47 min   Activity Tolerance Patient tolerated treatment well   Behavior During Therapy [facility] for tasks assessed/performed      Past Medical History  Diagnosis Date  . Asthma     Past Surgical History  Procedure Laterality Date  . Anterior cruciate ligament repair Left   . Anterior cruciate ligament repair Left 10/30/2014    Procedure: RECONSTRUCTION ANTERIOR CRUCIATE LIGAMENT (ACL) LEFT, PATELLER- TENDON AUTOGRAFT, MEDIAL MENISECTOMY;  Surgeon: Carole Civil, MD;  Location: [facility];  Service: Orthopedics;  Laterality: Left;    There were no vitals taken for this visit.  Visit Diagnosis:  Aftercare for anterior cruciate ligament (ACL) repair  Stiffness of knee joint, left  Decreased strength involving knee joint      Subjective Assessment - 12/09/14 0905    Symptoms Pt states he's having a little lateral Lt knee pain today, rated 5/10.   States only hurts when he extends his knee fully   Currently in Pain? Yes   Pain Score 5    Pain Location Knee   Pain Orientation Left            [facility] Adult PT Treatment/Exercise - 12/09/14 0903    Knee/Hip Exercises: Stretches   Gastroc Stretch 1 rep;60 seconds   Knee/Hip Exercises: Aerobic   Elliptical 10 minutes level 1 fwd/bkwd 5' each   Isokinetic biodex 180-150-120 15 reps each 90-40 degrees only   Knee/Hip Exercises: Standing   Heel Raises 15 reps   Heel Raises Limitations with squat 4"  step blue ball   Forward Lunges Left;15 reps   Forward Lunges Limitations 4" with reach 3 way with blue ball   Lateral Step Up Left;15 reps;Hand Hold: 0;Step Height: 8"   Forward Step Up Left;15 reps;Hand Hold: 0;Step Height: 8"   Step Down Left;15 reps;Hand Hold: 0;Step Height: 4"   Stairs 2 RT reciprocally no HR both sides   SLS with Vectors 10X5" each   Other Standing Knee Exercises squat matrix 10 reps 3# UE reach neutral position             PT Short Term Goals - 11/16/14 1229    PT SHORT TERM GOAL #1   Title I HEP    Time 1   Period Weeks   Status Achieved   PT SHORT TERM GOAL #2   Title Able to walk without crutch and immobilizer   Baseline therpist has now instructed pt to walk with immobilizer unlocked 11/16/2014   Period Weeks   Status Partially Met   PT SHORT TERM GOAL #3   Title ROM to 120  degress to be able to sit with leg bend to be able to go out to eat   Baseline 11/03/14: knee flexion 95   Time 3   Period Weeks   Status On-going   PT SHORT TERM GOAL #4   Title Pain no greater  than a 5/10    Time 3   Period Weeks           PT Long Term Goals - 11/09/14 1016    PT LONG TERM GOAL #1   Title Pt I in advance HEP6   Period Weeks   PT LONG TERM GOAL #2   Title ROM to 135 to be able to squat   Time 6   Period Weeks   PT LONG TERM GOAL #3   Title strength 5/5 to allow pt to go back to work   Time 6   Period Weeks   PT LONG TERM GOAL #4   Title Pt to pain to be no greater than a 2/10 80% of the day   Time 6   Period Weeks               Plan - 12/09/14 0930    Clinical Impression Statement Pt late for appointment.  Decreased to 4" box for lunge matrix with UE movements today, increased reps of isokinetic workout and completed reverse direction X 5 minutes on elliptical to activate quadricep muscles.  Pt able to complete all exercises without c/o pain.     PT Next Visit Plan Continue to progress per ACL protocol. Add sports cord, fitter  slide and increase difficulty of isokinetic 120-90-60 on Feb 1st (8 weeks).  Can increase to full ROM with isokinetic at 12 weeks .        Problem List Patient Active Problem List   Diagnosis Date Noted  . Medial meniscus, posterior horn derangement 10/15/2014  . ACL tear 09/24/2014  . DERANGEMENT MENISCUS 08/10/2008  . CRUCIATE SPRAIN 08/10/2008    Teena Irani, PTA/CLT [phone] 12/09/2014, 9:33 AM  [place] [address] [place], [place], [address] Phone: [phone]   Fax:  [phone]

## 2014-12-11 ENCOUNTER — Ambulatory Visit (HOSPITAL_COMMUNITY)
Admission: RE | Admit: 2014-12-11 | Discharge: 2014-12-11 | Disposition: A | Discharge status: Home / Self Care | Payer: BLUE CROSS/BLUE SHIELD | Source: Ambulatory Visit | Attending: Orthopedic Surgery | Admitting: Orthopedic Surgery

## 2014-12-11 DIAGNOSIS — S83512D Sprain of anterior cruciate ligament of left knee, subsequent encounter: Secondary | ICD-10-CM | POA: Diagnosis not present

## 2014-12-11 DIAGNOSIS — R29898 Other symptoms and signs involving the musculoskeletal system: Secondary | ICD-10-CM

## 2014-12-11 DIAGNOSIS — Z4789 Encounter for other orthopedic aftercare: Secondary | ICD-10-CM

## 2014-12-11 DIAGNOSIS — M25662 Stiffness of left knee, not elsewhere classified: Secondary | ICD-10-CM

## 2014-12-11 NOTE — Therapy (Signed)
[place] [facility] [address] [place], [place], [address] Phone: [phone]   Fax:  [phone]  Physical Therapy Treatment  Patient Details  Name: ODIS TURCK MRN: [id number] Date of Birth: 1991/02/21 Referring Provider:  Vickki Hearing, MD  Encounter Date: 12/11/2014      PT End of Session - 12/11/14 0918    Visit Number 11   Number of Visits 18   Date for PT Re-Evaluation 01/08/15   PT Start Time 0850   PT Stop Time 0940   PT Time Calculation (min) 50 min      Past Medical History  Diagnosis Date  . Asthma     Past Surgical History  Procedure Laterality Date  . Anterior cruciate ligament repair Left   . Anterior cruciate ligament repair Left 10/30/2014    Procedure: RECONSTRUCTION ANTERIOR CRUCIATE LIGAMENT (ACL) LEFT, PATELLER- TENDON AUTOGRAFT, MEDIAL MENISECTOMY;  Surgeon: Vickki Hearing, MD;  Location: [facility];  Service: Orthopedics;  Laterality: Left;    There were no vitals taken for this visit.  Visit Diagnosis:  No diagnosis found.      Subjective Assessment - 12/11/14 0903    Symptoms Pt states that he only having pain in the front of his knee   Pain Score 3    Pain Location Knee   Pain Orientation Left                    [facility] Adult PT Treatment/Exercise - 12/11/14 0001    Exercises   Exercises Knee/Hip   Knee/Hip Exercises: Stretches   Gastroc Stretch 3 reps;30 seconds   Gastroc Stretch Limitations plantar fascia stretch   Knee/Hip Exercises: Aerobic   Elliptical 10 minutes level 1 fwd/bkwd 5' each   Isokinetic biodex 180-150-120 15 reps each 90-40 degrees only   Knee/Hip Exercises: Standing   Heel Raises 15 reps   Heel Raises Limitations with squat 4" step oarange  ball   Lateral Step Up Left;15 reps;Hand Hold: 0;Step Height: 8"   Forward Step Up Left;15 reps;Hand Hold: 0;Step Height: 8"   Step Down Left;15 reps;Hand Hold: 0;Step Height: 4"   Functional Squat 15 reps   Functional Squat Limitations single leg    Stairs 2 RT reciprocally no HR both sides   SLS with Vectors 10" each  on foam   Other Standing Knee Exercises forward and retro gt with both sports cord    Other Standing Knee Exercises squat matrix 10 reps 3# UE reach neutral position                  PT Short Term Goals - 12/11/14 0925    PT SHORT TERM GOAL #1   Title I HEP    Period Weeks   Status Achieved   PT SHORT TERM GOAL #2   Title Able to walk without crutch and immobilizer   Baseline therpist has now instructed pt to walk with immobilizer unlocked 11/16/2014   Time 3   Period Weeks   Status Achieved   PT SHORT TERM GOAL #3   Title ROM to 120  degress to be able to sit with leg bend to be able to go out to eat   Baseline 11/03/14: knee flexion 95   Time 3   Period Weeks   Status Achieved   PT SHORT TERM GOAL #4   Title Pain no greater than a 5/10    Time 3   Status Achieved  PT Long Term Goals - 12/11/14 0925    PT LONG TERM GOAL #1   Title Pt I in advance HEP6   Time 6   Period Weeks   Status Achieved   PT LONG TERM GOAL #2   Title ROM to 135 to be able to squat   Time 6   Period Weeks   PT LONG TERM GOAL #3   Title strength 5/5 to allow pt to go back to work   Time 6   Period Weeks   Status On-going   PT LONG TERM GOAL #4   Title Pt to pain to be no greater than a 2/10 80% of the day   Time 6   Period Weeks   Status On-going               Plan - 12/11/14 16100921    Clinical Impression Statement Pt ROM is normal.  Pt is at 6 weeks out from surgery.  Added sportscord walk to program as well as foam to improve balance.  Pt progressing well with protocol     PT Next Visit Plan Continue to progress per ACL protocol. Add sports cord, fitter slide and increase difficulty of isokinetic 120-90-60 on Feb 1st (8 weeks).  Can increase to full ROM with isokinetic at 12 weeks .        Problem List Patient Active Problem List    Diagnosis Date Noted  . Medial meniscus, posterior horn derangement 10/15/2014  . ACL tear 09/24/2014  . DERANGEMENT MENISCUS 08/10/2008  . CRUCIATE SPRAIN 08/10/2008    RUSSELL,CINDY PT  12/11/2014, 9:27 AM  [place] [facility] [address] [place], [place], [address] Phone: [phone]   Fax:  [phone]

## 2014-12-14 ENCOUNTER — Ambulatory Visit (HOSPITAL_COMMUNITY)
Admission: RE | Admit: 2014-12-14 | Discharge: 2014-12-14 | Disposition: A | Discharge status: Home / Self Care | Payer: BLUE CROSS/BLUE SHIELD | Source: Ambulatory Visit | Attending: Orthopedic Surgery | Admitting: Orthopedic Surgery

## 2014-12-14 DIAGNOSIS — R29898 Other symptoms and signs involving the musculoskeletal system: Secondary | ICD-10-CM

## 2014-12-14 DIAGNOSIS — M25662 Stiffness of left knee, not elsewhere classified: Secondary | ICD-10-CM

## 2014-12-14 DIAGNOSIS — S83512D Sprain of anterior cruciate ligament of left knee, subsequent encounter: Secondary | ICD-10-CM | POA: Diagnosis not present

## 2014-12-14 DIAGNOSIS — Z4789 Encounter for other orthopedic aftercare: Secondary | ICD-10-CM

## 2014-12-14 NOTE — Therapy (Signed)
[place] [facility] [address] [place], [place], [address] Phone: [phone]   Fax:  [phone]  Physical Therapy Treatment  Patient Details  Name: Thomas Heath MRN: [id number] Date of Birth: 11-23-1991 Referring Provider:  Vickki Hearing, MD  Encounter Date: 12/14/2014      PT End of Session - 12/14/14 0826    Visit Number 12   Number of Visits 18   Date for PT Re-Evaluation 01/08/15   Authorization Type BCBS   PT Start Time 0800   PT Stop Time 0849   PT Time Calculation (min) 49 min      Past Medical History  Diagnosis Date  . Asthma     Past Surgical History  Procedure Laterality Date  . Anterior cruciate ligament repair Left   . Anterior cruciate ligament repair Left 10/30/2014    Procedure: RECONSTRUCTION ANTERIOR CRUCIATE LIGAMENT (ACL) LEFT, PATELLER- TENDON AUTOGRAFT, MEDIAL MENISECTOMY;  Surgeon: Vickki Hearing, MD;  Location: [facility];  Service: Orthopedics;  Laterality: Left;    There were no vitals taken for this visit.  Visit Diagnosis:  Aftercare for anterior cruciate ligament (ACL) repair  Stiffness of knee joint, left  Decreased strength involving knee joint      Subjective Assessment - 12/14/14 0801    Symptoms Pt doing well    Currently in Pain? Yes   Pain Score 2    Pain Location Knee                    [facility] Adult PT Treatment/Exercise - 12/14/14 0806    Exercises   Exercises Knee/Hip   Knee/Hip Exercises: Stretches   Gastroc Stretch 3 reps;30 seconds   Gastroc Stretch Limitations plantar fascia stretch   Knee/Hip Exercises: Aerobic   Elliptical 10 minutes level 1 fwd/bkwd 5' each   Isokinetic biodex 180-150-120 15 reps each 90-40 degrees only   Knee/Hip Exercises: Standing   Heel Raises 15 reps   Heel Raises Limitations with squat 4" step oarange  ball   Lateral Step Up Left;15 reps;Hand Hold: 0;Step Height: 8"   Forward Step Up Left;15 reps;Hand Hold: 0;Step Height: 8"    Step Down Left;15 reps;Hand Hold: 0;Step Height: 4"   Functional Squat 15 reps   Functional Squat Limitations single leg    Stairs 2 RT reciprocally no HR both sides   SLS with Vectors 10" each  on foam   Walking with Sports Cord forward and backward both cords 1 RT of hall   Gait Training Baps Level 4 x x 10    Other Standing Knee Exercises Step onto Bosu x 10    Other Standing Knee Exercises squat matrix 10 reps 5# UE reach neutral position                  PT Short Term Goals - 12/11/14 0925    PT SHORT TERM GOAL #1   Title I HEP    Period Weeks   Status Achieved   PT SHORT TERM GOAL #2   Title Able to walk without crutch and immobilizer   Baseline therpist has now instructed pt to walk with immobilizer unlocked 11/16/2014   Time 3   Period Weeks   Status Achieved   PT SHORT TERM GOAL #3   Title ROM to 120  degress to be able to sit with leg bend to be able to go out to eat   Baseline 11/03/14: knee flexion 95   Time  3   Period Weeks   Status Achieved   PT SHORT TERM GOAL #4   Title Pain no greater than a 5/10    Time 3   Status Achieved           PT Long Term Goals - 12/11/14 0925    PT LONG TERM GOAL #1   Title Pt I in advance HEP6   Time 6   Period Weeks   Status Achieved   PT LONG TERM GOAL #2   Title ROM to 135 to be able to squat   Time 6   Period Weeks   PT LONG TERM GOAL #3   Title strength 5/5 to allow pt to go back to work   Time 6   Period Weeks   Status On-going   PT LONG TERM GOAL #4   Title Pt to pain to be no greater than a 2/10 80% of the day   Time 6   Period Weeks   Status On-going               Plan - 12/14/14 16100836    Clinical Impression Statement Pt increased to 5# with squat matrix'; improved form on steps.  Added Bosu To return to full duty work pt will need to complete high balance activity    PT Next Visit Plan Continue to progress per ACL protocol. Attempt squat matrix on upside down Bosu  Begin  fitter  slide and increase difficulty of isokinetic 120-90-60 on Feb 1st (8 weeks).  Can increase to full ROM with isokinetic at 12 weeks .        Problem List Patient Active Problem List   Diagnosis Date Noted  . Medial meniscus, posterior horn derangement 10/15/2014  . ACL tear 09/24/2014  . DERANGEMENT MENISCUS 08/10/2008  . CRUCIATE SPRAIN 08/10/2008    Iza Preston,CINDY PT/CLT 12/14/2014, 8:42 AM  [facility] [address] [place], [place], [address] Phone: [phone]   Fax:  [phone]

## 2014-12-16 ENCOUNTER — Ambulatory Visit (HOSPITAL_COMMUNITY)
Admission: RE | Admit: 2014-12-16 | Discharge: 2014-12-16 | Disposition: A | Discharge status: Home / Self Care | Payer: BLUE CROSS/BLUE SHIELD | Source: Ambulatory Visit | Attending: Orthopedic Surgery | Admitting: Orthopedic Surgery

## 2014-12-16 DIAGNOSIS — R29898 Other symptoms and signs involving the musculoskeletal system: Secondary | ICD-10-CM

## 2014-12-16 DIAGNOSIS — Z4789 Encounter for other orthopedic aftercare: Secondary | ICD-10-CM

## 2014-12-16 DIAGNOSIS — M25662 Stiffness of left knee, not elsewhere classified: Secondary | ICD-10-CM

## 2014-12-16 DIAGNOSIS — S83512D Sprain of anterior cruciate ligament of left knee, subsequent encounter: Secondary | ICD-10-CM | POA: Diagnosis not present

## 2014-12-16 NOTE — Therapy (Signed)
[place] [facility] [address] [place], [place], [address] Phone: [phone]   Fax:  [phone]  Physical Therapy Treatment  Patient Details  Name: Thomas Heath W Jaskolski MRN: [id number] Date of Birth: 12/25/1990 Referring Provider:  Vickki HearingHarrison, Stanley E, MD  Encounter Date: 12/16/2014      PT End of Session - 12/16/14 0907    Visit Number 13   Number of Visits 18   Date for PT Re-Evaluation 01/08/15   Authorization Type BCBS   PT Start Time 0858   PT Stop Time 0938   PT Time Calculation (min) 40 min   Activity Tolerance Patient tolerated treatment well   Behavior During Therapy [facility] for tasks assessed/performed      Past Medical History  Diagnosis Date  . Asthma     Past Surgical History  Procedure Laterality Date  . Anterior cruciate ligament repair Left   . Anterior cruciate ligament repair Left 10/30/2014    Procedure: RECONSTRUCTION ANTERIOR CRUCIATE LIGAMENT (ACL) LEFT, PATELLER- TENDON AUTOGRAFT, MEDIAL MENISECTOMY;  Surgeon: Vickki HearingStanley E Harrison, MD;  Location: [facility];  Service: Orthopedics;  Laterality: Left;    There were no vitals taken for this visit.  Visit Diagnosis:  Aftercare for anterior cruciate ligament (ACL) repair  Stiffness of knee joint, left  Decreased strength involving knee joint      Subjective Assessment - 12/16/14 0902    Symptoms Knee pain 4/10, feels the weather plays a part in the pain scale.  Reports  compliance with HEP   Currently in Pain? Yes   Pain Score 4    Pain Location Knee   Pain Orientation Left   Pain Descriptors / Indicators Aching          [facility] PT Assessment - 12/16/14 0001    Assessment   Medical Diagnosis Lt ACL repair   Onset Date 11/02/14   Next MD Visit Romeo AppleHarrison 12/24/14   Prior Therapy none                  [facility] Adult PT Treatment/Exercise - 12/16/14 0905    Exercises   Exercises Knee/Hip   Knee/Hip Exercises: Aerobic   Elliptical 10 minutes level 1  fwd/bkwd 5' each   Knee/Hip Exercises: Standing   Heel Raises 15 reps   Heel Raises Limitations with squat 4" step oarange  ball   Forward Lunges Limitations   Forward Lunges Limitations lunge walking 1RT   Lateral Step Up 20 reps;Hand Hold: 0;Left;Step Height: 8"   Forward Step Up Left;20 reps;Hand Hold: 0;Step Height: 8"   Step Down Left;5 reps;Step Height: 4";10 reps;Hand Hold: 0   Step Down Limitations single leg balance reach   Functional Squat 15 reps;2 sets   Functional Squat Limitations BOSU upside down wide and narrow BOS   Walking with Sports Cord forward and backward both cords 1 RT of hall   Gait Training Baps Level 4 x x 10                   PT Short Term Goals - 12/16/14 0907    PT SHORT TERM GOAL #1   Title I HEP    Status Achieved   PT SHORT TERM GOAL #2   Title Able to walk without crutch and immobilizer   Status Achieved   PT SHORT TERM GOAL #3   Title ROM to 120  degress to be able to sit with leg bend to be able to go out to eat  Status Achieved   PT SHORT TERM GOAL #4   Title Pain no greater than a 5/10    Status Achieved           PT Long Term Goals - 12/16/14 0908    PT LONG TERM GOAL #1   Title Pt I in advance HEP6   Status Achieved   PT LONG TERM GOAL #2   Title ROM to 135 to be able to squat   Status On-going   PT LONG TERM GOAL #3   Title strength 5/5 to allow pt to go back to work   Status On-going   PT LONG TERM GOAL #4   Title Pt to pain to be no greater than a 2/10 80% of the day   Status On-going               Plan - 12/16/14 0945    Clinical Impression Statement Pt late for appointment today, unable to complete full plan.  Progressed to squat matrix on BOSU with visible musculuature fatigue with change in surface..  Pt. demonstrated good control with all exercises this session with min cueing for form or technique.  Pt stated pain reduced at end of session.     PT Next Visit Plan Continue to progress per ACL  protocol. Continue squat matrix on upside down Bosu  Begin  fitter slide and increase difficulty of isokinetic 120-90-60 on Feb 1st (8 weeks).  Can increase to full ROM with isokinetic at 12 weeks .        Problem List Patient Active Problem List   Diagnosis Date Noted  . Medial meniscus, posterior horn derangement 10/15/2014  . ACL tear 09/24/2014  . DERANGEMENT MENISCUS 08/10/2008  . CRUCIATE SPRAIN 08/10/2008   Becky Sax, LPTA [phone]  Juel Burrow 12/16/2014, 10:03 AM  [place] [facility] [address] [place], [place], [address] Phone: [phone]   Fax:  [phone]

## 2014-12-17 ENCOUNTER — Ambulatory Visit (HOSPITAL_COMMUNITY)
Admission: RE | Admit: 2014-12-17 | Discharge: 2014-12-17 | Disposition: A | Discharge status: Home / Self Care | Payer: BLUE CROSS/BLUE SHIELD | Source: Ambulatory Visit | Attending: Orthopedic Surgery | Admitting: Orthopedic Surgery

## 2014-12-17 DIAGNOSIS — R29898 Other symptoms and signs involving the musculoskeletal system: Secondary | ICD-10-CM

## 2014-12-17 DIAGNOSIS — Z4789 Encounter for other orthopedic aftercare: Secondary | ICD-10-CM

## 2014-12-17 DIAGNOSIS — S83512D Sprain of anterior cruciate ligament of left knee, subsequent encounter: Secondary | ICD-10-CM | POA: Diagnosis not present

## 2014-12-17 DIAGNOSIS — M25662 Stiffness of left knee, not elsewhere classified: Secondary | ICD-10-CM

## 2014-12-17 NOTE — Therapy (Signed)
[facility] [address] [place], [place], [address] Phone: [phone]   Fax:  [phone]  Physical Therapy Treatment  Patient Details  Name: Thomas Heath MRN: [id number] Date of Birth: 08/01/1991 Referring Provider:  Vickki HearingHarrison, Stanley E, MD  Encounter Date: 12/17/2014      PT End of Session - 12/17/14 0923    Visit Number 14   Number of Visits 18   Date for PT Re-Evaluation 01/08/15   Authorization Type BCBS   PT Start Time 0848   PT Stop Time 0932   PT Time Calculation (min) 44 min   Activity Tolerance Patient tolerated treatment well   Behavior During Therapy [facility] for tasks assessed/performed      Past Medical History  Diagnosis Date  . Asthma     Past Surgical History  Procedure Laterality Date  . Anterior cruciate ligament repair Left   . Anterior cruciate ligament repair Left 10/30/2014    Procedure: RECONSTRUCTION ANTERIOR CRUCIATE LIGAMENT (ACL) LEFT, PATELLER- TENDON AUTOGRAFT, MEDIAL MENISECTOMY;  Surgeon: Vickki HearingStanley E Harrison, MD;  Location: [facility];  Service: Orthopedics;  Laterality: Left;    There were no vitals taken for this visit.  Visit Diagnosis:  Aftercare for anterior cruciate ligament (ACL) repair  Stiffness of knee joint, left  Decreased strength involving knee joint      Subjective Assessment - 12/17/14 0857    Symptoms Current Lt knee pain scale 4/10 today.   Currently in Pain? Yes   Pain Score 4    Pain Location Knee   Pain Orientation Left                    [facility] Adult PT Treatment/Exercise - 12/17/14 0900    Exercises   Exercises Knee/Hip   Knee/Hip Exercises: Aerobic   Elliptical 10 minutes level 2 fwd/bkwd 5' each   Isokinetic biodex 180-150-120 15 reps each 90-40 degrees only   Knee/Hip Exercises: Standing   Heel Raises 15 reps   Heel Raises Limitations with squat 4" step oarange  ball   Forward Lunges Limitations   Forward Lunges Limitations lunge walking 2RT   Lateral Step Up 20 reps;Hand Hold: 0;Left;Step Height: 8"   Forward Step Up Left;20 reps;Hand Hold: 0;Step Height: 8"   Step Down Left;10 reps;Step Height: 6";5 reps;Step Height: 2"   Step Down Limitations single leg balance reach in sagittal and frontal place   Functional Squat 15 reps;2 sets   Functional Squat Limitations BOSU upside down wide and narrow BOS   SLS with Vectors 10" each 5 reps with protobation   Rebounder 10 reps Bil LE on foam, 20 reps SLS on foam   Walking with Sports Cord forward and backward both cords 1 RT of hall                  PT Short Term Goals - 12/17/14 0929    PT SHORT TERM GOAL #1   Title I HEP    Status Achieved   PT SHORT TERM GOAL #2   Title Able to walk without crutch and immobilizer   Status Achieved   PT SHORT TERM GOAL #3   Title ROM to 120  degress to be able to sit with leg bend to be able to go out to eat   Status Achieved   PT SHORT TERM GOAL #4   Title Pain no greater than a 5/10    Status Achieved  PT Long Term Goals - 12/17/14 0930    PT LONG TERM GOAL #1   Title Pt I in advance HEP6   Status Achieved   PT LONG TERM GOAL #2   Title ROM to 135 to be able to squat   Status On-going   PT LONG TERM GOAL #3   Title strength 5/5 to allow pt to go back to work   Status On-going   PT LONG TERM GOAL #4   Title Pt to pain to be no greater than a 2/10 80% of the day   Status On-going               Plan - 12/17/14 1610    Clinical Impression Statement Pt demonstrated appropriate form and technique with all exercises per ACL protocol.  Added protobation with vector stance and began rebounder on foam to progress stabiltiy of knee.  Able to increase height with step down for quadricep eccentric control stremgtjemomg.  Pt stated pain free at end of session.     PT Next Visit Plan Continue to progress per ACL protocol. Continue squat matrix on upside down Bosu  Begin  fitter slide and increase difficulty of  isokinetic 120-90-60 on Feb 1st (8 weeks).  Can increase to full ROM with isokinetic at 12 weeks .        Problem List Patient Active Problem List   Diagnosis Date Noted  . Medial meniscus, posterior horn derangement 10/15/2014  . ACL tear 09/24/2014  . DERANGEMENT MENISCUS 08/10/2008  . CRUCIATE SPRAIN 08/10/2008   Becky Sax, LPTA [phone]  Juel Burrow 12/17/2014, 9:30 AM  [place] [facility] [address] [place], [place], [address] Phone: [phone]   Fax:  [phone]

## 2014-12-18 ENCOUNTER — Ambulatory Visit (HOSPITAL_COMMUNITY): Payer: BLUE CROSS/BLUE SHIELD | Admitting: Physical Therapy

## 2014-12-22 ENCOUNTER — Ambulatory Visit (HOSPITAL_COMMUNITY)
Admission: RE | Admit: 2014-12-22 | Discharge: 2014-12-22 | Disposition: A | Discharge status: Home / Self Care | Payer: BLUE CROSS/BLUE SHIELD | Source: Ambulatory Visit | Attending: Orthopedic Surgery | Admitting: Orthopedic Surgery

## 2014-12-22 DIAGNOSIS — Z4789 Encounter for other orthopedic aftercare: Secondary | ICD-10-CM

## 2014-12-22 DIAGNOSIS — S83512D Sprain of anterior cruciate ligament of left knee, subsequent encounter: Secondary | ICD-10-CM | POA: Diagnosis not present

## 2014-12-22 DIAGNOSIS — M25662 Stiffness of left knee, not elsewhere classified: Secondary | ICD-10-CM

## 2014-12-22 DIAGNOSIS — R29898 Other symptoms and signs involving the musculoskeletal system: Secondary | ICD-10-CM

## 2014-12-22 NOTE — Therapy (Signed)
[place] [facility] [address] [place], [place], [address] Phone: [phone]   Fax:  [phone]  Physical Therapy Treatment  Patient Details  Name: Thomas Heath MRN: [id number] Date of Birth: 05/09/1991 Referring Provider:  Vickki HearingHarrison, Stanley E, MD  Encounter Date: 12/22/2014      PT End of Session - 12/22/14 0901    Visit Number 15   Number of Visits 18   Date for PT Re-Evaluation 01/08/15   Authorization Type BCBS   PT Start Time 0808   PT Stop Time 0846   PT Time Calculation (min) 38 min   Activity Tolerance Patient tolerated treatment well   Behavior During Therapy [facility] for tasks assessed/performed      Past Medical History  Diagnosis Date  . Asthma     Past Surgical History  Procedure Laterality Date  . Anterior cruciate ligament repair Left   . Anterior cruciate ligament repair Left 10/30/2014    Procedure: RECONSTRUCTION ANTERIOR CRUCIATE LIGAMENT (ACL) LEFT, PATELLER- TENDON AUTOGRAFT, MEDIAL MENISECTOMY;  Surgeon: Vickki HearingStanley E Harrison, MD;  Location: [facility];  Service: Orthopedics;  Laterality: Left;    There were no vitals taken for this visit.  Visit Diagnosis:  Aftercare for anterior cruciate ligament (ACL) repair  Stiffness of knee joint, left  Decreased strength involving knee joint      Subjective Assessment - 12/22/14 0811    Symptoms Pt reports generalized Lt knee pain at 4/10, after having to walk in the snow and play with kids outside.    Currently in Pain? Yes   Pain Score 4    Pain Location Knee   Pain Orientation Left          [facility] PT Assessment - 12/22/14 0001    Assessment   Medical Diagnosis Lt ACL repair   Onset Date 10/30/14   Next MD Visit Romeo AppleHarrison 12/24/14                  [facility] Adult PT Treatment/Exercise - 12/22/14 0001    Knee/Hip Exercises: Stretches   Active Hamstring Stretch 1 rep;10 seconds   Active Hamstring Stretch Limitations 17" box, 3 way for cool down   Quad  Stretch 2 reps;10 seconds   Quad Stretch Limitations Standing, for cool down   Gastroc Stretch 3 reps;10 seconds   Gastroc Stretch Limitations Slantboard for warm up   Knee/Hip Exercises: Aerobic   Elliptical 10 minutes level 2 fwd/bkwd 5' each   Knee/Hip Exercises: Machines for Strengthening   Cybex Knee Flexion Eccentric Lt LE 4Pl x10, (B) LE 5Pl x10, single Lt LE 3Pl x10   Knee/Hip Exercises: Standing   Step Down 10 reps;Left;Hand Hold: 1   Step Down Limitations BOSU heel taps   Functional Squat 5 reps   Functional Squat Limitations Squat Reach Matrix, black side BOSU with 5#   Wall Squat 5 reps;10 seconds   Wall Squat Limitations @75  degrees   Lunge Walking - Round Trips Forward/Backward 30' RT with resisted sport cord   SLS Bob and Weave on BOSU x10   SLS with Vectors Prop[address], GTB 30" x1 each on Airex   Other Standing Knee Exercises Monster Walk, BTB 90' total with alternating BodyBlade stab exercises                  PT Short Term Goals - 12/17/14 0929    PT SHORT TERM GOAL #1   Title I HEP    Status Achieved  PT SHORT TERM GOAL #2   Title Able to walk without crutch and immobilizer   Status Achieved   PT SHORT TERM GOAL #3   Title ROM to 120  degress to be able to sit with leg bend to be able to go out to eat   Status Achieved   PT SHORT TERM GOAL #4   Title Pain no greater than a 5/10    Status Achieved           PT Long Term Goals - 12/17/14 0930    PT LONG TERM GOAL #1   Title Pt I in advance HEP6   Status Achieved   PT LONG TERM GOAL #2   Title ROM to 135 to be able to squat   Status On-going   PT LONG TERM GOAL #3   Title strength 5/5 to allow pt to go back to work   Status On-going   PT LONG TERM GOAL #4   Title Pt to pain to be no greater than a 2/10 80% of the day   Status On-going               Plan - 12/22/14 0901    Clinical Impression Statement Pt is currently 7 1/2 weeks s/p Lt ACL, with progression of exercises  per  protocol. Pt reports generalized complaints of lt knee pain prior to PT, secondary to walking and playing with kids in snow over the weekend; by end of treatment session no increases in pain, only muscle fatigue reported.  Challneged strengthening program, alternating muscular groups to avoid fatigue, though pt did require some standing rest breaks during monster walks and wall sits. Continued to challenge single leg balance activities on BOSU and Airex, with different body movement to work on reactive balance in different planes of movement.    Pt will benefit from skilled therapeutic intervention in order to improve on the following deficits Decreased activity tolerance;Decreased balance;Decreased range of motion;Decreased strength;Increased edema   Rehab Potential Good   PT Frequency 3x / week   PT Duration 6 weeks   PT Treatment/Interventions Therapeutic exercise;Therapeutic activities;Functional mobility training;Stair training;Gait training;Patient/family education;Passive range of motion;Scar mobilization;Manual techniques   PT Next Visit Plan MD note next visit, measuring single leg squat to compare surgical and non-surgical quad strength to progress in protocol.  Begin fitter slide and increase difficulty of isokinetic 120-90-60 on Feb 1st (8 weeks).  Can increase to full ROM with isokinetic at 12 weeks .        Problem List Patient Active Problem List   Diagnosis Date Noted  . Medial meniscus, posterior horn derangement 10/15/2014  . ACL tear 09/24/2014  . DERANGEMENT MENISCUS 08/10/2008  . CRUCIATE SPRAIN 08/10/2008    Kellie Shropshire, DPT [phone]  12/22/2014, 9:16 AM  [place] [facility] [address] [place], [place], [address] Phone: [phone]   Fax:  [phone]

## 2014-12-24 ENCOUNTER — Ambulatory Visit (INDEPENDENT_AMBULATORY_CARE_PROVIDER_SITE_OTHER): Payer: Self-pay | Admitting: Orthopedic Surgery

## 2014-12-24 ENCOUNTER — Ambulatory Visit: Payer: BLUE CROSS/BLUE SHIELD

## 2014-12-24 ENCOUNTER — Encounter: Payer: Self-pay | Admitting: Orthopedic Surgery

## 2014-12-24 ENCOUNTER — Ambulatory Visit (INDEPENDENT_AMBULATORY_CARE_PROVIDER_SITE_OTHER): Payer: BLUE CROSS/BLUE SHIELD

## 2014-12-24 ENCOUNTER — Ambulatory Visit (HOSPITAL_COMMUNITY)
Admission: RE | Admit: 2014-12-24 | Discharge: 2014-12-24 | Disposition: A | Discharge status: Home / Self Care | Payer: BLUE CROSS/BLUE SHIELD | Source: Ambulatory Visit | Attending: Orthopedic Surgery | Admitting: Orthopedic Surgery

## 2014-12-24 VITALS — BP 150/99 | Ht 67.0 in | Wt 203.0 lb

## 2014-12-24 DIAGNOSIS — M25662 Stiffness of left knee, not elsewhere classified: Secondary | ICD-10-CM

## 2014-12-24 DIAGNOSIS — M23322 Other meniscus derangements, posterior horn of medial meniscus, left knee: Secondary | ICD-10-CM

## 2014-12-24 DIAGNOSIS — S83512D Sprain of anterior cruciate ligament of left knee, subsequent encounter: Secondary | ICD-10-CM

## 2014-12-24 DIAGNOSIS — Z9889 Other specified postprocedural states: Secondary | ICD-10-CM

## 2014-12-24 DIAGNOSIS — R29898 Other symptoms and signs involving the musculoskeletal system: Secondary | ICD-10-CM

## 2014-12-24 DIAGNOSIS — Z4789 Encounter for other orthopedic aftercare: Secondary | ICD-10-CM

## 2014-12-24 MED ORDER — HYDROCODONE-ACETAMINOPHEN 5-325 MG PO TABS: 1.0000 | ORAL_TABLET | Freq: Four times a day (QID) | ORAL | Status: DC | PRN

## 2014-12-24 NOTE — Patient Instructions (Signed)
OUT OF WORK 6 MORE WEEKS

## 2014-12-24 NOTE — Progress Notes (Signed)
Chief Complaint  Patient presents with  . Follow-up    follow up + xray Left ACL, DOS 10/30/14   BP 150/99 mmHg  Ht 5\' 7"  (1.702 m)  Wt 203 lb (92.08 kg)  BMI 31.79 kg/m2  Revision anterior cruciate ligament surgery left knee with new anatomic technique doing well full range of motion stable Lachman x-ray looks good  Noted on x-ray you can tell the difference between the 2 tunnels from the previous techniques to now just noting  Follow-up 6 weeks hinged knee brace economy hinged brace  Return to work date will be February 29  Come back to see me in 6 weeks

## 2014-12-24 NOTE — Therapy (Signed)
[place] [facility] [address] [place], [place], [address] Phone: [phone]   Fax:  [phone]  Physical Therapy Treatment  Patient Details  Name: Thomas Heath MRN: [id number] Date of Birth: Jan 26, 1991 Referring Provider:  Vickki Hearing, MD  Encounter Date: 12/24/2014      PT End of Session - 12/24/14 0855    Visit Number 16   Number of Visits 24   Date for PT Re-Evaluation 01/21/15  Certified thru 01/08/2015   Authorization Type BCBS   PT Start Time 0850   PT Stop Time 0940   PT Time Calculation (min) 50 min   Activity Tolerance Patient tolerated treatment well   Behavior During Therapy [facility] for tasks assessed/performed      Past Medical History  Diagnosis Date  . Asthma     Past Surgical History  Procedure Laterality Date  . Anterior cruciate ligament repair Left   . Anterior cruciate ligament repair Left 10/30/2014    Procedure: RECONSTRUCTION ANTERIOR CRUCIATE LIGAMENT (ACL) LEFT, PATELLER- TENDON AUTOGRAFT, MEDIAL MENISECTOMY;  Surgeon: Vickki Hearing, MD;  Location: [facility];  Service: Orthopedics;  Laterality: Left;    There were no vitals taken for this visit.  Visit Diagnosis:  Aftercare for anterior cruciate ligament (ACL) repair  Stiffness of knee joint, left  Decreased strength involving knee joint      Subjective Assessment - 12/24/14 0901    Symptoms Increased pain noted at nught or doing the low lunges.  Current pain scale 2/10   How long can you sit comfortably? Able to sit comfortable 4-5/ hours (Pt keeps his leg straight able to sit for two hours )   How long can you stand comfortably? Able to stand for 3 hours comfortably with no AD (Pt has not really tried to stand on his own he has the crutches.  With crutches he is able to stand for two hours. )   How long can you walk comfortably? Able to walk from house to store, 2 hours with no AD (With crutches and immobilizer  walked for ten minutes  straight)   Currently in Pain? Yes   Pain Score 2    Pain Location Knee          [facility] PT Assessment - 12/24/14 0905    Assessment   Medical Diagnosis Lt ACL repair   Onset Date 10/30/14   Next MD Visit Romeo Apple 12/24/14   Prior Therapy none   Observation/Other Assessments   Focus on Therapeutic Outcomes (FOTO)  56  was 56   Functional Tests   Functional tests Single Leg Squat  Lt 65 degrees and Rt 93 degrees= 66%   AROM   Left Knee Extension 0   Left Knee Flexion 133  was 95, Rt 140 degrees   Strength   Left Hip Flexion 4/5  was 2+/5   Left Hip Extension 4+/5  was 4/5   Left Hip ABduction 5/5   Left Knee Flexion 4/5  was 3+/5   Left Knee Extension 4/5                  [facility] Adult PT Treatment/Exercise - 12/24/14 0917    Exercises   Exercises Knee/Hip   Knee/Hip Exercises: Stretches   Active Hamstring Stretch 3 reps;30 seconds   Active Hamstring Stretch Limitations 17" box, 3 way    Quad Stretch 3 reps;30 seconds   Quad Stretch Limitations prone with rope   Gastroc Stretch 3 reps;30 seconds  Gastroc Stretch Limitations Slantboard    Knee/Hip Exercises: Aerobic   Elliptical 10 minutes level 2 fwd/bkwd 5' each   Knee/Hip Exercises: Machines for Strengthening   Cybex Knee Flexion Eccentric Lt LE 4.5Pl x10, (B) LE 5.5Pl x10, single Lt LE 4.2Pl x10   Knee/Hip Exercises: Standing   Functional Squat 20 reps   Functional Squat Limitations Squat Reach Matrix, black side BOSU with 5#   Wall Squat 5 reps;10 seconds   Walking with Sports Cord lunge walking forward and backwards30 feet   Other Standing Knee Exercises Monster Walk, BTB 90' total with alternating BodyBlade stab exercises                  PT Short Term Goals - 12/24/14 0853    PT SHORT TERM GOAL #1   Title I HEP    Status Achieved   PT SHORT TERM GOAL #2   Title Able to walk without crutch and immobilizer   Status Achieved   PT SHORT TERM GOAL #3   Title ROM to 120  degress to be  able to sit with leg bend to be able to go out to eat   Status Achieved   PT SHORT TERM GOAL #4   Title Pain no greater than a 5/10    Status Achieved           PT Long Term Goals - 12/24/14 0857    PT LONG TERM GOAL #1   Title Pt I in advance HEP6   Status Achieved   PT LONG TERM GOAL #2   Title ROM to 135 to be able to squat   Baseline 132 degrees fkexion   Status On-going   PT LONG TERM GOAL #3   Title strength 5/5 to allow pt to go back to work   Status On-going   PT LONG TERM GOAL #4   Title Pt to pain to be no greater than a 2/10 80% of the day   Status On-going               Plan - 12/24/14 0911    Clinical Impression Statement Reassessment complete prior MD apt with the following findings:  Pt independent with HEP and able to demonstrate appropraite technique with all exercises.  Pt reported increased Lt knee pain at night.  Overall strength progressing well, ROM improving wtih 132 degrees flexion Lt and 140 degrees Rt.  Single leg squat measurement complete wtih vast difference between surgicqal and non-surgical LE, currently quad strength at 66%.  Pt will continue to benefit from skilled intervention to reduce risk of injury for RTW safely.     PT Next Visit Plan Recommend continuing OPPT for 4 more weeks at 2x a week to address goals unmet, f/u with MD.  .Continue with MD ACL protocol.  Begin fitter slide and increase difficulty of isokinetic 120-90-60 on Feb 1st (8 weeks).  Can increase to full ROM with isokinetic at 12 weeks .        Problem List Patient Active Problem List   Diagnosis Date Noted  . Medial meniscus, posterior horn derangement 10/15/2014  . ACL tear 09/24/2014  . DERANGEMENT MENISCUS 08/10/2008  . CRUCIATE SPRAIN 08/10/2008   Becky Saxasey Cockerham, LPTA/[facility]  [phone]  Juel BurrowCockerham, Casey Jo 12/24/2014, 9:58 AM  [place] [facility] [address] [place], [place], [address] Phone:  [phone]   Fax:  [phone]

## 2014-12-28 ENCOUNTER — Encounter (HOSPITAL_COMMUNITY): Payer: Self-pay | Admitting: Physical Therapy

## 2014-12-31 ENCOUNTER — Ambulatory Visit (HOSPITAL_COMMUNITY)
Admission: RE | Admit: 2014-12-31 | Discharge: 2014-12-31 | Disposition: A | Discharge status: Home / Self Care | Payer: BLUE CROSS/BLUE SHIELD | Source: Ambulatory Visit | Attending: Orthopedic Surgery | Admitting: Orthopedic Surgery

## 2014-12-31 DIAGNOSIS — Z4789 Encounter for other orthopedic aftercare: Secondary | ICD-10-CM

## 2014-12-31 DIAGNOSIS — M6281 Muscle weakness (generalized): Secondary | ICD-10-CM | POA: Insufficient documentation

## 2014-12-31 DIAGNOSIS — R29898 Other symptoms and signs involving the musculoskeletal system: Secondary | ICD-10-CM

## 2014-12-31 DIAGNOSIS — M25562 Pain in left knee: Secondary | ICD-10-CM | POA: Insufficient documentation

## 2014-12-31 DIAGNOSIS — S83512D Sprain of anterior cruciate ligament of left knee, subsequent encounter: Secondary | ICD-10-CM | POA: Insufficient documentation

## 2014-12-31 DIAGNOSIS — M25662 Stiffness of left knee, not elsewhere classified: Secondary | ICD-10-CM | POA: Diagnosis not present

## 2014-12-31 NOTE — Addendum Note (Signed)
Encounter addended by: Lurena NidaAmy B Junius Faucett, PTA on: 12/31/2014 11:24 AM<BR>     Documentation filed: Flowsheet VN

## 2014-12-31 NOTE — Therapy (Signed)
[place] [facility] [address] [place], [place], [address] Phone: [phone]   Fax:  [phone]  Physical Therapy Treatment  Patient Details  Name: Thomas Heath MRN: [id number] Date of Birth: [birth year] Referring Provider:  Vickki Hearing, MD  Encounter Date: 12/31/2014      PT End of Session - 12/31/14 1012    Visit Number 17   Number of Visits 24   Date for PT Re-Evaluation 01/21/15  Certified thru 01/08/2015   Authorization Type BCBS   PT Start Time 0930   PT Stop Time 1025   PT Time Calculation (min) 55 min   Activity Tolerance Patient tolerated treatment well   Behavior During Therapy [facility] for tasks assessed/performed      Past Medical History  Diagnosis Date  . Asthma     Past Surgical History  Procedure Laterality Date  . Anterior cruciate ligament repair Left   . Anterior cruciate ligament repair Left 10/30/2014    Procedure: RECONSTRUCTION ANTERIOR CRUCIATE LIGAMENT (ACL) LEFT, PATELLER- TENDON AUTOGRAFT, MEDIAL MENISECTOMY;  Surgeon: Vickki Hearing, MD;  Location: [facility];  Service: Orthopedics;  Laterality: Left;    There were no vitals taken for this visit.  Visit Diagnosis:  Aftercare for anterior cruciate ligament (ACL) repair  Stiffness of knee joint, left  Decreased strength involving knee joint      Subjective Assessment - 12/31/14 0934    Symptoms Pt states he has pain in patellar tendon region with certain movements rating 4/10.   Currently in Pain? Yes   Pain Score 4    Pain Location Knee   Pain Orientation Left                    [facility] Adult PT Treatment/Exercise - 12/31/14 0936    Knee/Hip Exercises: Stretches   Active Hamstring Stretch 30 seconds;2 reps   Active Hamstring Stretch Limitations 17" box, 3 way    Quad Stretch 3 reps;30 seconds   Quad Stretch Limitations standing with HH   Gastroc Stretch 3 reps;30 seconds   Gastroc Stretch Limitations Slantboard    Knee/Hip  Exercises: Aerobic   Elliptical 10 minutes level 5 fwd/bkwd 5' each   Isokinetic biodex 120-90-60 10 reps each 90-40 degrees only   Knee/Hip Exercises: Standing   Functional Squat 20 reps   Functional Squat Limitations Squat Reach Matrix 10 reps, black side BOSU with 5#   SLS with Vectors on BOSU 10 reps 5" holds   Walking with Sports Cord lunge walking forward and backwards30 feet   Other Standing Knee Exercises Monster Walk, BTB 90' total with alternating BodyBlade stab exercises 3RT (Rt UE, Lt UE, bilateral UE)   Other Standing Knee Exercises fitter slide 10 reps with bilateral HHA   Modalities   Modalities Cryotherapy   Cryotherapy   Number Minutes Cryotherapy 10 Minutes   Cryotherapy Location Knee   Type of Cryotherapy Ice massage                  PT Short Term Goals - 12/24/14 0853    PT SHORT TERM GOAL #1   Title I HEP    Status Achieved   PT SHORT TERM GOAL #2   Title Able to walk without crutch and immobilizer   Status Achieved   PT SHORT TERM GOAL #3   Title ROM to 120  degress to be able to sit with leg bend to be able to go out to eat  Status Achieved   PT SHORT TERM GOAL #4   Title Pain no greater than a 5/10    Status Achieved           PT Long Term Goals - 12/24/14 0857    PT LONG TERM GOAL #1   Title Pt I in advance HEP6   Status Achieved   PT LONG TERM GOAL #2   Title ROM to 135 to be able to squat   Baseline 132 degrees fkexion   Status On-going   PT LONG TERM GOAL #3   Title strength 5/5 to allow pt to go back to work   Status On-going   PT LONG TERM GOAL #4   Title Pt to pain to be no greater than a 2/10 80% of the day   Status On-going               Plan - 12/31/14 1013    Clinical Impression Statement Progressed per protocol today increasing resistance with biodex isokinetic, adding fitter slide and vector stance on BOSU ball.  Increased difficulty to level 5 on the elliptical to further increase LE strength and  endurance.  Pt without c/o durign session.  Iced at EOS to decrease inflammation.  pt encouraged to ice at home using either ice pack or ice massage to patellar tendon.  Pt verbalized understanding.     PT Next Visit Plan Continue with MD ACL protocol.  Can increase to full ROM with isokinetic at 12 weeks (feb 29) .        Problem List Patient Active Problem List   Diagnosis Date Noted  . Medial meniscus, posterior horn derangement 10/15/2014  . ACL tear 09/24/2014  . DERANGEMENT MENISCUS 08/10/2008  . CRUCIATE SPRAIN 08/10/2008    Lurena Nidamy B Dat Derksen, PTA/CLT [phone] 12/31/2014, 11:05 AM  [place] [facility] [address] [place], [place], [address] Phone: [phone]   Fax:  [phone]

## 2015-01-05 ENCOUNTER — Ambulatory Visit (HOSPITAL_COMMUNITY)
Admission: RE | Admit: 2015-01-05 | Discharge: 2015-01-05 | Disposition: A | Discharge status: Home / Self Care | Payer: BLUE CROSS/BLUE SHIELD | Source: Ambulatory Visit | Attending: Orthopedic Surgery | Admitting: Orthopedic Surgery

## 2015-01-05 DIAGNOSIS — R29898 Other symptoms and signs involving the musculoskeletal system: Secondary | ICD-10-CM

## 2015-01-05 DIAGNOSIS — M25662 Stiffness of left knee, not elsewhere classified: Secondary | ICD-10-CM

## 2015-01-05 DIAGNOSIS — S83512D Sprain of anterior cruciate ligament of left knee, subsequent encounter: Secondary | ICD-10-CM | POA: Diagnosis not present

## 2015-01-05 DIAGNOSIS — Z4789 Encounter for other orthopedic aftercare: Secondary | ICD-10-CM

## 2015-01-05 NOTE — Therapy (Signed)
[facility] [address] [place], [place], [address] Phone: [phone]   Fax:  [phone]  Physical Therapy Treatment  Patient Details  Name: Thomas Heath MRN: [id number] Date of Birth: 02/04/1991 Referring Provider:  Vickki HearingHarrison, Stanley E, MD  Encounter Date: 01/05/2015      PT End of Session - 01/05/15 0854    Visit Number 18   Number of Visits 24   Date for PT Re-Evaluation 01/21/15   Authorization Type BCBS   Authorization Time Period MD sent new orders on 12/24/14 to extend therapy X 4 more weeks   PT Start Time 0842   PT Stop Time 0926   PT Time Calculation (min) 44 min   Activity Tolerance Patient tolerated treatment well   Behavior [facility] Therapy [facility] for tasks assessed/performed      Past Medical History  Diagnosis Date  . Asthma     Past Surgical History  Procedure Laterality Date  . Anterior cruciate ligament repair Left   . Anterior cruciate ligament repair Left 10/30/2014    Procedure: RECONSTRUCTION ANTERIOR CRUCIATE LIGAMENT (ACL) LEFT, PATELLER- TENDON AUTOGRAFT, MEDIAL MENISECTOMY;  Surgeon: Vickki HearingStanley E Harrison, MD;  Location: [facility];  Service: Orthopedics;  Laterality: Left;    There were no vitals taken for this visit.  Visit Diagnosis:  Aftercare for anterior cruciate ligament (ACL) repair  Stiffness of knee joint, left  Decreased strength involving knee joint      Subjective Assessment - 01/05/15 0845    Symptoms Pain scale 5/10 anterior knee, has been completeing ice massage at home that does assist   Currently in Pain? Yes   Pain Score 5    Pain Location Knee   Pain Orientation Left;Anterior          [facility] PT Assessment - 01/05/15 0001    Assessment   Medical Diagnosis Lt ACL repair   Onset Date 10/30/14   Next MD Visit Romeo AppleHarrison 01/21/2015   Prior Therapy none                  [facility] Adult PT Treatment/Exercise - 01/05/15 0847    Exercises   Exercises Knee/Hip   Knee/Hip  Exercises: Stretches   Active Hamstring Stretch 30 seconds;2 reps   Active Hamstring Stretch Limitations 17" box, 3 way    Quad Stretch 3 reps;30 seconds   Quad Stretch Limitations standing with HH   Gastroc Stretch 3 reps;30 seconds   Gastroc Stretch Limitations Slantboard    Knee/Hip Exercises: Aerobic   Elliptical 10 minutes level 6 fwd/bkwd 5' each   Isokinetic biodex 120-90-60 10 reps each 90-40 degrees only   Knee/Hip Exercises: Standing   Functional Squat Limitations Squat Reach Matrix 10 reps, black side BOSU with 5#   SLS with Vectors on BOSU 10 reps 5" holds   Walking with Sports Cord lunge walking forward and backwards with high knee reach 5#30 feet   Other Standing Knee Exercises fitter slide 10 reps with bilateral HHA   Modalities   Modalities Cryotherapy   Cryotherapy   Number Minutes Cryotherapy 6 Minutes   Cryotherapy Location Knee   Type of Cryotherapy Ice massage                  PT Short Term Goals - 01/05/15 0910    PT SHORT TERM GOAL #1   Title I HEP    Status Achieved   PT SHORT TERM GOAL #2   Title Able to walk without crutch  and immobilizer   Status Achieved   PT SHORT TERM GOAL #3   Title ROM to 120  degress to be able to sit with leg bend to be able to go out to eat   Status Achieved   PT SHORT TERM GOAL #4   Title Pain no greater than a 5/10    Status Achieved           PT Long Term Goals - 01/05/15 0910    PT LONG TERM GOAL #1   Title Pt I in advance HEP6   Status Achieved   PT LONG TERM GOAL #2   Title ROM to 135 to be able to squat   Status On-going   PT LONG TERM GOAL #3   Title strength 5/5 to allow pt to go back to work   Status On-going   PT LONG TERM GOAL #4   Title Pt to pain to be no greater than a 2/10 80% of the day   Status On-going               Plan - 01/05/15 0855    Clinical Impression Statement Added high knee reaching with lunge walking to increase weight bearing to hamstrings and gluts to  reduce strain anterior knee with lunges.  Pt reported pain reduced following therex to 2/10.  Continued ice massage to patella tendon for pain and edema control.     PT Next Visit Plan Continue with MD ACL protocol.  Can increase to full ROM with isokinetic at 12 weeks (feb 29) .        Problem List Patient Active Problem List   Diagnosis Date Noted  . Medial meniscus, posterior horn derangement 10/15/2014  . ACL tear 09/24/2014  . DERANGEMENT MENISCUS 08/10/2008  . CRUCIATE SPRAIN 08/10/2008   Becky Sax, LPTA [phone]  Juel Burrow 01/05/2015, 9:27 AM  [place] [facility] [address] [place], [place], [address] Phone: [phone]   Fax:  [phone]

## 2015-01-07 ENCOUNTER — Ambulatory Visit (HOSPITAL_COMMUNITY)
Admission: RE | Admit: 2015-01-07 | Discharge: 2015-01-07 | Disposition: A | Discharge status: Home / Self Care | Payer: BLUE CROSS/BLUE SHIELD | Source: Ambulatory Visit | Attending: Orthopedic Surgery | Admitting: Orthopedic Surgery

## 2015-01-07 DIAGNOSIS — R29898 Other symptoms and signs involving the musculoskeletal system: Secondary | ICD-10-CM

## 2015-01-07 DIAGNOSIS — Z4789 Encounter for other orthopedic aftercare: Secondary | ICD-10-CM

## 2015-01-07 DIAGNOSIS — M25662 Stiffness of left knee, not elsewhere classified: Secondary | ICD-10-CM

## 2015-01-07 DIAGNOSIS — S83512D Sprain of anterior cruciate ligament of left knee, subsequent encounter: Secondary | ICD-10-CM | POA: Diagnosis not present

## 2015-01-07 NOTE — Therapy (Signed)
[facility] [facility] [address] [place], [place], [address] Phone: [phone]   Fax:  [phone]  Physical Therapy Treatment  Patient Details  Name: Thomas Heath MRN: [id number] Date of Birth: 04/11/1991 Referring Provider:  Vickki Hearing, MD  Encounter Date: 01/07/2015      PT End of Session - 01/07/15 0844    Visit Number 19   Number of Visits 24   Date for PT Re-Evaluation 01/21/15   Authorization Type BCBS   PT Start Time 0801   PT Stop Time 0849   PT Time Calculation (min) 48 min      Past Medical History  Diagnosis Date  . Asthma     Past Surgical History  Procedure Laterality Date  . Anterior cruciate ligament repair Left   . Anterior cruciate ligament repair Left 10/30/2014    Procedure: RECONSTRUCTION ANTERIOR CRUCIATE LIGAMENT (ACL) LEFT, PATELLER- TENDON AUTOGRAFT, MEDIAL MENISECTOMY;  Surgeon: Vickki Hearing, MD;  Location: [facility];  Service: Orthopedics;  Laterality: Left;    There were no vitals taken for this visit.  Visit Diagnosis:  Aftercare for anterior cruciate ligament (ACL) repair  Stiffness of knee joint, left  Decreased strength involving knee joint      Subjective Assessment - 01/07/15 0803    Symptoms Pt states that his pain is ata 4/10 only in one area.  Pt uses brace when he is hurting.    Currently in Pain? Yes   Pain Score 5    Pain Location Knee   Pain Orientation Left   Pain Descriptors / Indicators Aching              [facility] Adult PT Treatment/Exercise - 01/07/15 0001    Knee/Hip Exercises: Standing   Forward Step Up Left;20 reps;Limitations   Forward Step Up Limitations 12" no hands    Functional Squat 10 reps  ON bOSU SQUAT MATRIX w 5#   Wall Squat 10 reps   Wall Squat Limitations  x 30 "   SLS with Vectors --   Other Standing Knee Exercises balance master static Level 6 stability 4.4 no hands: dynamic 1:32 at level 8 no hands    Other Standing Knee Exercises  fitter slide 10 reps with bilateral HHA   Knee/Hip Exercises: Prone   Hamstring Curl 10 reps   Hamstring Curl Limitations PRE 09/11/19 #x s10 Reps each                 PT Education - 01/07/15 0846    Education provided Yes   Education Details stop subsitutional patterns when exercising   Person(s) Educated Patient   Methods Explanation   Comprehension Returned demonstration          PT Short Term Goals - 01/05/15 0910    PT SHORT TERM GOAL #1   Title I HEP    Status Achieved   PT SHORT TERM GOAL #2   Title Able to walk without crutch and immobilizer   Status Achieved   PT SHORT TERM GOAL #3   Title ROM to 120  degress to be able to sit with leg bend to be able to go out to eat   Status Achieved   PT SHORT TERM GOAL #4   Title Pain no greater than a 5/10    Status Achieved           PT Long Term Goals - 01/05/15 0910    PT LONG TERM GOAL #1  Title Pt I in advance HEP6   Status Achieved   PT LONG TERM GOAL #2   Title ROM to 135 to be able to squat   Status On-going   PT LONG TERM GOAL #3   Title strength 5/5 to allow pt to go back to work   Status On-going   PT LONG TERM GOAL #4   Title Pt to pain to be no greater than a 2/10 80% of the day   Status On-going               Plan - 01/07/15 0845    Clinical Impression Statement Added balance master, step ups to 12" (pt climbs ladders for a living), and PRE hamstring to ensure hamstring mm get to 5/5.  Pt reports pain decreased to a 3/10 with activity.    PT Next Visit Plan Continue with MD ACL protocol.  Can increase to full ROM with isokinetic at 12 weeks (feb 29) .        Problem List Patient Active Problem List   Diagnosis Date Noted  . Medial meniscus, posterior horn derangement 10/15/2014  . ACL tear 09/24/2014  . DERANGEMENT MENISCUS 08/10/2008  . CRUCIATE SPRAIN 08/10/2008    RUSSELL,CINDY PT 01/07/2015, 8:47 AM  [place] [facility] [address] [place], [place], [address] Phone: [phone]   Fax:  [phone]

## 2015-01-12 ENCOUNTER — Ambulatory Visit (HOSPITAL_COMMUNITY): Payer: BLUE CROSS/BLUE SHIELD | Admitting: Physical Therapy

## 2015-01-14 ENCOUNTER — Ambulatory Visit (HOSPITAL_COMMUNITY): Payer: BLUE CROSS/BLUE SHIELD | Admitting: Physical Therapy

## 2015-01-14 DIAGNOSIS — R29898 Other symptoms and signs involving the musculoskeletal system: Secondary | ICD-10-CM

## 2015-01-14 DIAGNOSIS — S83512D Sprain of anterior cruciate ligament of left knee, subsequent encounter: Secondary | ICD-10-CM | POA: Diagnosis not present

## 2015-01-14 DIAGNOSIS — M25662 Stiffness of left knee, not elsewhere classified: Secondary | ICD-10-CM

## 2015-01-14 DIAGNOSIS — Z4789 Encounter for other orthopedic aftercare: Secondary | ICD-10-CM

## 2015-01-14 NOTE — Therapy (Signed)
[facility] [address] [place], [place], [address] Phone: [phone]   Fax:  [phone]  Physical Therapy Treatment  Patient Details  Name: Thomas Heath MRN: [id number] Date of Birth: 01/25/1991 Referring Provider:  Vickki HearingHarrison, Stanley E, MD  Encounter Date: 01/14/2015      PT End of Session - 01/14/15 0840    Visit Number 20   Number of Visits 24   Date for PT Re-Evaluation 01/21/15   Authorization Type BCBS   Authorization Time Period MD sent new orders on 12/24/14 to extend therapy X 4 more weeks   PT Start Time 0800   PT Stop Time 0844   PT Time Calculation (min) 44 min   Activity Tolerance Patient tolerated treatment well   Behavior During Therapy [facility] for tasks assessed/performed      Past Medical History  Diagnosis Date  . Asthma     Past Surgical History  Procedure Laterality Date  . Anterior cruciate ligament repair Left   . Anterior cruciate ligament repair Left 10/30/2014    Procedure: RECONSTRUCTION ANTERIOR CRUCIATE LIGAMENT (ACL) LEFT, PATELLER- TENDON AUTOGRAFT, MEDIAL MENISECTOMY;  Surgeon: Vickki HearingStanley E Harrison, MD;  Location: [facility];  Service: Orthopedics;  Laterality: Left;    There were no vitals taken for this visit.  Visit Diagnosis:  Aftercare for anterior cruciate ligament (ACL) repair  Stiffness of knee joint, left  Decreased strength involving knee joint      Subjective Assessment - 01/14/15 0801    Symptoms Patient states that he is continuing to have 4/10 pain; still using flexible brace when pain present   Currently in Pain? Yes   Pain Score 4    Pain Location Knee   Pain Orientation Left                    [facility] Adult PT Treatment/Exercise - 01/14/15 0001    Knee/Hip Exercises: Stretches   Active Hamstring Stretch 3 reps;30 seconds   Active Hamstring Stretch Limitations 14" box   Quad Stretch 3 reps;30 seconds   ITB Stretch 3 reps;30 seconds   Gastroc Stretch 3 reps;30  seconds   Gastroc Stretch Limitations Slantboard    Knee/Hip Exercises: Aerobic   Elliptical 10 minutes level 6 fwd/bkwd 5' each   Knee/Hip Exercises: Standing   Forward Lunges 1 set;20 reps;Other (comment)  with side step up onto 8 inch box on L, cues for safe perf.    Lateral Step Up Left;1 set;20 reps;Step Height: 8"   Forward Step Up Left;20 reps;Limitations   Forward Step Up Limitations 12" no hands    SLS with Vectors on BOSU: star vector reaches SLS; mini-squats with 5# 1x10; tandem stance x5 with 10 second holds each side   Other Standing Knee Exercises fitter slider 20 reps with bilateral HHA                  PT Short Term Goals - 01/05/15 0910    PT SHORT TERM GOAL #1   Title I HEP    Status Achieved   PT SHORT TERM GOAL #2   Title Able to walk without crutch and immobilizer   Status Achieved   PT SHORT TERM GOAL #3   Title ROM to 120  degress to be able to sit with leg bend to be able to go out to eat   Status Achieved   PT SHORT TERM GOAL #4   Title Pain no greater than a 5/10  Status Achieved           PT Long Term Goals - 01/05/15 0910    PT LONG TERM GOAL #1   Title Pt I in advance HEP6   Status Achieved   PT LONG TERM GOAL #2   Title ROM to 135 to be able to squat   Status On-going   PT LONG TERM GOAL #3   Title strength 5/5 to allow pt to go back to work   Status On-going   PT LONG TERM GOAL #4   Title Pt to pain to be no greater than a 2/10 80% of the day   Status On-going               Plan - 01/14/15 0841    Clinical Impression Statement Continued to focus on functional hamstring strengthening today along with dynamic balance tasks on BOSU ball. Trialed side stepping up onto elevated surface, with specific cues for proper form and technique to maintain safety of repair; patient tolerated all activities well with no increases in pain or difficulty with task. Pain down to 3/10 at end of session as compared to 4/10 at beginning of  session.    Pt will benefit from skilled therapeutic intervention in order to improve on the following deficits Decreased activity tolerance;Decreased balance;Decreased range of motion;Decreased strength;Increased edema   Rehab Potential Good   PT Frequency 3x / week   PT Duration 6 weeks   PT Treatment/Interventions Therapeutic exercise;Therapeutic activities;Functional mobility training;Stair training;Gait training;Patient/family education;Passive range of motion;Scar mobilization;Manual techniques   PT Next Visit Plan Continue with MD ACL protocol.  Can increase to full ROM with isokinetic at 12 weeks (feb 29) .        Problem List Patient Active Problem List   Diagnosis Date Noted  . Medial meniscus, posterior horn derangement 10/15/2014  . ACL tear 09/24/2014  . DERANGEMENT MENISCUS 08/10/2008  . CRUCIATE SPRAIN 08/10/2008    Nedra Hai PT, DPT [phone]  [facility] [place] [address] [place], [place], [address] Phone: [phone]   Fax:  [phone]

## 2015-01-18 ENCOUNTER — Ambulatory Visit (HOSPITAL_COMMUNITY): Payer: BLUE CROSS/BLUE SHIELD | Admitting: Physical Therapy

## 2015-01-21 ENCOUNTER — Encounter: Payer: Self-pay | Admitting: Orthopedic Surgery

## 2015-01-21 ENCOUNTER — Ambulatory Visit (INDEPENDENT_AMBULATORY_CARE_PROVIDER_SITE_OTHER): Payer: BLUE CROSS/BLUE SHIELD | Admitting: Orthopedic Surgery

## 2015-01-21 ENCOUNTER — Ambulatory Visit (HOSPITAL_COMMUNITY): Payer: BLUE CROSS/BLUE SHIELD | Admitting: Physical Therapy

## 2015-01-21 VITALS — BP 90/64 | Ht 67.0 in | Wt 203.0 lb

## 2015-01-21 DIAGNOSIS — Z9889 Other specified postprocedural states: Secondary | ICD-10-CM

## 2015-01-21 MED ORDER — HYDROCODONE-ACETAMINOPHEN 5-325 MG PO TABS: 1.0000 | ORAL_TABLET | Freq: Four times a day (QID) | ORAL | Status: DC | PRN

## 2015-01-21 MED ORDER — PROMETHAZINE HCL 12.5 MG PO TABS: 12.5000 mg | ORAL_TABLET | Freq: Four times a day (QID) | ORAL | Status: DC | PRN

## 2015-01-21 NOTE — Progress Notes (Signed)
Chief Complaint  Patient presents with  . Follow-up    4 week follow up post op ACL DOS 10/30/14    Encounter Diagnosis  Name Primary?  . S/P ACL surgery Yes    Patient had revision anterior cruciate ligament surgery of the left knee   He is almost 3 months out  He is doing well therapy is going well. He is in a hinged brace economy hinged slide on file. Exam shows stable Lachman and pivot  He will return to work March 18  He will return turned to therapy until that time. He will continue Phenergan and Norco.

## 2015-01-22 ENCOUNTER — Ambulatory Visit (HOSPITAL_COMMUNITY): Payer: BLUE CROSS/BLUE SHIELD | Admitting: Physical Therapy

## 2015-01-27 ENCOUNTER — Ambulatory Visit (HOSPITAL_COMMUNITY): Payer: BLUE CROSS/BLUE SHIELD | Attending: Orthopedic Surgery

## 2015-01-27 DIAGNOSIS — M6281 Muscle weakness (generalized): Secondary | ICD-10-CM | POA: Insufficient documentation

## 2015-01-27 DIAGNOSIS — M25562 Pain in left knee: Secondary | ICD-10-CM | POA: Insufficient documentation

## 2015-01-27 DIAGNOSIS — M25662 Stiffness of left knee, not elsewhere classified: Secondary | ICD-10-CM | POA: Insufficient documentation

## 2015-01-27 DIAGNOSIS — S83512D Sprain of anterior cruciate ligament of left knee, subsequent encounter: Secondary | ICD-10-CM | POA: Insufficient documentation

## 2015-02-03 ENCOUNTER — Telehealth: Payer: Self-pay | Admitting: Orthopedic Surgery

## 2015-02-03 ENCOUNTER — Encounter: Payer: Self-pay | Admitting: Orthopedic Surgery

## 2015-02-03 NOTE — Telephone Encounter (Signed)
Patient aware and note is ready

## 2015-02-03 NOTE — Telephone Encounter (Signed)
Patient is calling requesting a release note to return to work full time, please advise?

## 2015-02-03 NOTE — Telephone Encounter (Signed)
Approved.  

## 2015-02-08 ENCOUNTER — Ambulatory Visit (HOSPITAL_COMMUNITY): Payer: BLUE CROSS/BLUE SHIELD | Admitting: Physical Therapy

## 2015-02-11 ENCOUNTER — Encounter (HOSPITAL_COMMUNITY): Payer: BLUE CROSS/BLUE SHIELD | Admitting: Physical Therapy

## 2015-02-16 ENCOUNTER — Encounter (HOSPITAL_COMMUNITY): Payer: BLUE CROSS/BLUE SHIELD | Admitting: Physical Therapy

## 2015-02-19 ENCOUNTER — Encounter (HOSPITAL_COMMUNITY): Payer: BLUE CROSS/BLUE SHIELD | Admitting: Physical Therapy

## 2015-02-23 ENCOUNTER — Encounter (HOSPITAL_COMMUNITY): Payer: BLUE CROSS/BLUE SHIELD | Admitting: Physical Therapy

## 2015-02-25 ENCOUNTER — Encounter (HOSPITAL_COMMUNITY): Payer: BLUE CROSS/BLUE SHIELD | Admitting: Physical Therapy

## 2015-03-03 NOTE — Therapy (Signed)
[place], [place], [address] Phone: [phone]   Fax:  [phone]  Patient Details  Name: Thomas Heath MRN: [id number] Date of Birth: Jun 16, 1991 Referring Provider:  Carole Civil, MD  Encounter Date: 01/14/2015  PHYSICAL THERAPY DISCHARGE SUMMARY  Visits from Start of Care: 20 Current functional level related to goals / functional outcomes: Pt returning to work has good strength; ROM functional but not equal to opposite knee.  Pain level still varies between 2-4   Remaining deficits: Pain and ROM   Education / Equipment: HEP  Plan: Patient agrees to discharge.  Patient goals were partially met. Patient is being discharged due to being pleased with the current functional level.  ?????      Rayetta Humphrey, PT CLT [phone] 03/03/2015, 2:29 PM  [place] [address] [place], [place], [address] Phone: [phone]   Fax:  [phone]

## 2015-03-23 ENCOUNTER — Ambulatory Visit (INDEPENDENT_AMBULATORY_CARE_PROVIDER_SITE_OTHER): Payer: BLUE CROSS/BLUE SHIELD | Admitting: Orthopedic Surgery

## 2015-03-23 ENCOUNTER — Encounter: Payer: Self-pay | Admitting: Orthopedic Surgery

## 2015-03-23 VITALS — BP 146/93 | Ht 67.0 in | Wt 192.8 lb

## 2015-03-23 DIAGNOSIS — Z9889 Other specified postprocedural states: Secondary | ICD-10-CM

## 2015-03-23 MED ORDER — PROMETHAZINE HCL 12.5 MG PO TABS: 12.5000 mg | ORAL_TABLET | Freq: Four times a day (QID) | ORAL | Status: DC | PRN

## 2015-03-23 MED ORDER — HYDROCODONE-ACETAMINOPHEN 5-325 MG PO TABS: 1.0000 | ORAL_TABLET | Freq: Four times a day (QID) | ORAL | Status: DC | PRN

## 2015-03-23 NOTE — Progress Notes (Signed)
Chief Complaint  Patient presents with  . Follow-up    2 month follow up Left ACL, DOS 10/30/14    The patient is 4 months postop from a revision anterior cruciate ligament with a partial medial meniscectomy there were chondral changes on the medial femoral condyle his return to work doing well he wears a small hinge knee brace to work  He did play basketball felt something when he did a spin move and then didn't want anymore. Today he has a grade 1 pivot grade 1 Lachman trace positive Lachman on the left  Collateral ligaments are stable no swelling full range of motion it should be noted that he is a hyperextend her and probably has some form of ligamentous laxity  Neurovascular intact he walks without limp he has no effusion in his knees no swelling or warmth  Impression revision anterior cruciate ligament with grade 1 pivot and Lockman question if he reinjured the knee on this spin move currently doing okay would like a refill on Norco and Phenergan.  We can refill that on this occasion as he is under 6 months from surgery but we need to wean him down to tramadol as he goes forward  Follow-up as needed continue bracing at work  Meds ordered this encounter  Medications  . HYDROcodone-acetaminophen (NORCO/VICODIN) 5-325 MG per tablet    Sig: Take 1 tablet by mouth every 6 (six) hours as needed for moderate pain.    Dispense:  60 tablet    Refill:  0  . promethazine (PHENERGAN) 12.5 MG tablet    Sig: Take 1 tablet (12.5 mg total) by mouth every 6 (six) hours as needed for nausea or vomiting.    Dispense:  30 tablet    Refill:  0

## 2015-05-18 ENCOUNTER — Telehealth: Payer: Self-pay | Admitting: Orthopedic Surgery

## 2015-05-18 NOTE — Telephone Encounter (Signed)
Patient was released in April for ACL injury, do you want to refill?

## 2015-05-18 NOTE — Telephone Encounter (Signed)
Patient is calling requesting a refill on medication HYDROcodone-acetaminophen (NORCO/VICODIN) 5-325 MG per tablet and promethazine (PHENERGAN) 12.5 MG tablet please advise?

## 2015-05-18 NOTE — Telephone Encounter (Signed)
NO  GIVE IBUPROFEN 800 MG 3 X A DAY # 90  3 REFILLS

## 2015-05-19 NOTE — Telephone Encounter (Signed)
PATIENT IS AWARE 

## 2016-11-13 ENCOUNTER — Encounter (HOSPITAL_COMMUNITY): Payer: Self-pay | Admitting: Emergency Medicine

## 2016-11-13 ENCOUNTER — Emergency Department (HOSPITAL_COMMUNITY)
Admission: EM | Admit: 2016-11-13 | Discharge: 2016-11-13 | Disposition: A | Discharge status: Home / Self Care | Payer: BLUE CROSS/BLUE SHIELD | Attending: Emergency Medicine | Admitting: Emergency Medicine

## 2016-11-13 DIAGNOSIS — J45909 Unspecified asthma, uncomplicated: Secondary | ICD-10-CM | POA: Insufficient documentation

## 2016-11-13 DIAGNOSIS — K029 Dental caries, unspecified: Secondary | ICD-10-CM

## 2016-11-13 DIAGNOSIS — I10 Essential (primary) hypertension: Secondary | ICD-10-CM

## 2016-11-13 MED ORDER — ACETAMINOPHEN-CODEINE #3 300-30 MG PO TABS: 1.0000 | ORAL_TABLET | Freq: Four times a day (QID) | ORAL | 0 refills | Status: DC | PRN

## 2016-11-13 MED ORDER — CLINDAMYCIN HCL 150 MG PO CAPS: 150.0000 mg | ORAL_CAPSULE | Freq: Four times a day (QID) | ORAL | 0 refills | Status: DC

## 2016-11-13 MED ORDER — IBUPROFEN 800 MG PO TABS
800.0000 mg | ORAL_TABLET | Freq: Once | ORAL | Status: AC
  Administered 2016-11-13: 800 mg via ORAL
  Filled 2016-11-13: qty 1

## 2016-11-13 MED ORDER — IBUPROFEN 600 MG PO TABS: 600.0000 mg | ORAL_TABLET | Freq: Four times a day (QID) | ORAL | 0 refills | Status: DC

## 2016-11-13 MED ORDER — ACETAMINOPHEN-CODEINE #3 300-30 MG PO TABS
2.0000 | ORAL_TABLET | Freq: Once | ORAL | Status: AC
  Administered 2016-11-13: 2 via ORAL
  Filled 2016-11-13: qty 2

## 2016-11-13 MED ORDER — CEFTRIAXONE SODIUM 1 G IJ SOLR
1.0000 g | Freq: Once | INTRAMUSCULAR | Status: AC
  Administered 2016-11-13: 1 g via INTRAMUSCULAR
  Filled 2016-11-13: qty 10

## 2016-11-13 MED ORDER — PROMETHAZINE HCL 12.5 MG PO TABS
12.5000 mg | ORAL_TABLET | Freq: Once | ORAL | Status: AC
  Administered 2016-11-13: 12.5 mg via ORAL
  Filled 2016-11-13: qty 1

## 2016-11-13 NOTE — Discharge Instructions (Signed)
Your blood pressure is elevated at 172/118. Please see Dr. Gerda DissLuking or a member of his team for reevaluation and management of this problem. You have a very deep cavity of a right molar. It is extremely important that she see a dentist as soon as possible concerning this infected tooth. Please use clindamycin and ibuprofen with breakfast, lunch, dinner, bedtime. Use Tylenol codeine for more severe pain. This medication may cause drowsiness. Please do not drink, drive, or participate in activity that requires concentration while taking this medication.

## 2016-11-13 NOTE — ED Provider Notes (Signed)
[facility] Provider Note   CSN: [id number] Arrival date & time: 11/13/16  1743     History   Chief Complaint Chief Complaint  Patient presents with  . Dental Pain    HPI Clearence Thomas Heath is a 25 y.o. male.  The history is provided by the patient.  Dental Pain   This is a new problem. The current episode started yesterday. The problem occurs hourly. The problem has been gradually worsening. The pain is moderate. Treatments tried: norco. The treatment provided mild relief.    Past Medical History:  Diagnosis Date  . Asthma     Patient Active Problem List   Diagnosis Date Noted  . Medial meniscus, posterior horn derangement 10/15/2014  . ACL tear 09/24/2014  . DERANGEMENT MENISCUS 08/10/2008  . CRUCIATE SPRAIN 08/10/2008    Past Surgical History:  Procedure Laterality Date  . ANTERIOR CRUCIATE LIGAMENT REPAIR Left   . ANTERIOR CRUCIATE LIGAMENT REPAIR Left 10/30/2014   Procedure: RECONSTRUCTION ANTERIOR CRUCIATE LIGAMENT (ACL) LEFT, PATELLER- TENDON AUTOGRAFT, MEDIAL MENISECTOMY;  Surgeon: Vickki HearingStanley E Harrison, MD;  Location: [facility];  Service: Orthopedics;  Laterality: Left;       Home Medications    Prior to Admission medications   Medication Sig Start Date End Date Taking? Authorizing Provider  HYDROcodone-acetaminophen (NORCO/VICODIN) 5-325 MG per tablet Take 1 tablet by mouth every 6 (six) hours as needed for moderate pain. 03/23/15   Vickki HearingStanley E Harrison, MD  ibuprofen (ADVIL,MOTRIN) 800 MG tablet Take 1 tablet (800 mg total) by mouth every 8 (eight) hours as needed. Patient not taking: Reported on 03/23/2015 11/12/14   Vickki HearingStanley E Harrison, MD  promethazine (PHENERGAN) 12.5 MG tablet Take 1 tablet (12.5 mg total) by mouth every 6 (six) hours as needed for nausea or vomiting. 03/23/15   Vickki HearingStanley E Harrison, MD    Family History No family history on file.  Social History Social History  Substance Use Topics  . Smoking status: Never Smoker  . Smokeless  tobacco: Not on file  . Alcohol use No     Allergies   Patient has no known allergies.   Review of Systems Review of Systems  HENT: Positive for dental problem and trouble swallowing.   Gastrointestinal: Negative for vomiting.  All other systems reviewed and are negative.    Physical Exam Updated Vital Signs BP (!) 181/117   Pulse 75   Temp 98.9 F (37.2 C) (Oral)   Resp 20   Ht 5\' 7"  (1.702 m)   Wt 87.1 kg   SpO2 100%   BMI 30.07 kg/m   Physical Exam  Constitutional: He is oriented to person, place, and time. He appears well-developed and well-nourished.  Non-toxic appearance.  HENT:  Head: Normocephalic.  Right Ear: Tympanic membrane and external ear normal.  Left Ear: Tympanic membrane and external ear normal.  Multiple dental caries noted. Patient has swelling of the gums. No visible abscess. The airway is patent. Is no swelling under the tongue.  Eyes: EOM and lids are normal. Pupils are equal, round, and reactive to light.  Neck: Normal range of motion. Neck supple. Carotid bruit is not present.  Cardiovascular: Normal rate, regular rhythm, normal heart sounds, intact distal pulses and normal pulses.   Pulmonary/Chest: Breath sounds normal. No respiratory distress.  Abdominal: Soft. Bowel sounds are normal. There is no tenderness. There is no guarding.  Musculoskeletal: Normal range of motion.  Lymphadenopathy:       Head (right side): No submandibular adenopathy present.  Head (left side): No submandibular adenopathy present.    He has no cervical adenopathy.  Neurological: He is alert and oriented to person, place, and time. He has normal strength. No cranial nerve deficit or sensory deficit.  Skin: Skin is warm and dry.  Psychiatric: He has a normal mood and affect. His speech is normal.  Nursing note and vitals reviewed.    ED Treatments / Results  Labs (all labs ordered are listed, but only abnormal results are displayed) Labs Reviewed - No  data to display  EKG  EKG Interpretation None       Radiology No results found.  Procedures Procedures (including critical care time)  Medications Ordered in ED Medications - No data to display   Initial Impression / Assessment and Plan / ED Course  I have reviewed the triage vital signs and the nursing notes.  Pertinent labs & imaging results that were available during my care of the patient were reviewed by me and considered in my medical decision making (see chart for details).  Clinical Course     **I have reviewed nursing notes, vital signs, and all appropriate lab and imaging results for this patient.*  Final Clinical Impressions(s) / ED Diagnoses  The blood pressure is elevated at 172/118, otherwise the vital signs within normal limits. There is no visible abscess appreciated. Is no evidence for Lugwig's angina, or other emergent conditions appreciated. Patient treated with clindamycin, ibuprofen, and Tylenol codeine. Patient strongly encouraged to see a dentist as soon as possible.    Final diagnoses:  None    New Prescriptions New Prescriptions   No medications on file     Ivery QualeHobson Walta [place], PA-C 11/15/16 2203    Vanetta MuldersScott Zackowski, MD 11/16/16 [phone]

## 2016-11-13 NOTE — ED Triage Notes (Signed)
Pt C/O of tooth pain on the right lower jaw with swelling. Pt took hydrocodone PTA.

## 2017-07-25 ENCOUNTER — Other Ambulatory Visit: Payer: Self-pay | Admitting: Advanced Practice Midwife

## 2017-07-25 MED ORDER — AZITHROMYCIN 500 MG PO TABS: 1000.0000 mg | ORAL_TABLET | Freq: Once | ORAL | 0 refills | Status: AC

## 2017-07-25 NOTE — Progress Notes (Signed)
Azithromycin 2gm PO for patner *(charity chesnut). + CHL

## 2018-05-28 ENCOUNTER — Encounter: Payer: Self-pay | Admitting: Family Medicine

## 2018-05-28 ENCOUNTER — Ambulatory Visit (INDEPENDENT_AMBULATORY_CARE_PROVIDER_SITE_OTHER): Payer: Self-pay | Admitting: Family Medicine

## 2018-05-28 DIAGNOSIS — I10 Essential (primary) hypertension: Secondary | ICD-10-CM

## 2018-05-28 MED ORDER — ENALAPRIL MALEATE 10 MG PO TABS: 10.0000 mg | ORAL_TABLET | Freq: Every day | ORAL | 2 refills | Status: DC

## 2018-05-28 NOTE — Progress Notes (Signed)
   Subjective:    Patient ID: Thomas Heath, male    DOB: 09/01/1991, 27 y.o.   MRN: [id number]  HPIpt was on BP meds years ago but stopped because he did not like the way the med made him feel. Had a physical for a job but did not pass the BP. Was told he needed to be on bp med. Needs a letter to turn in stating he is getting bp treated.   Pos fam hx of high blood pressure  g fa had it    Smokes teo cigsrs per dsy  Exercises days most days    Review of Systems No headache, no major weight loss or weight gain, no chest pain no back pain abdominal pain no change in bowel habits complete ROS otherwise negative     Objective:   Physical Exam   Alert vitals stable, NAD. Blood pressure good on repeat. HEENT normal. Lungs clear. Heart regular rate and rhythm.      Assessment & Plan:  1 impression hypertension.  Review of prior notes reveals positive history of this in the past.  Positive family history.  Blood pressure 2 elevated today keeping him from getting a job.  Will initiate Vasotec 10 mg 1 nightly follow-up in 1 month educational information given

## 2018-05-28 NOTE — Patient Instructions (Signed)

## 2018-07-01 ENCOUNTER — Ambulatory Visit: Payer: Self-pay | Admitting: Family Medicine

## 2018-07-12 ENCOUNTER — Ambulatory Visit (INDEPENDENT_AMBULATORY_CARE_PROVIDER_SITE_OTHER): Payer: Self-pay | Admitting: Family Medicine

## 2018-07-12 ENCOUNTER — Encounter: Payer: Self-pay | Admitting: Family Medicine

## 2018-07-12 VITALS — BP 130/90 | Ht 67.0 in | Wt 203.4 lb

## 2018-07-12 DIAGNOSIS — I10 Essential (primary) hypertension: Secondary | ICD-10-CM

## 2018-07-12 NOTE — Progress Notes (Signed)
   Subjective:    Patient ID: Thomas Heath, male    DOB: 12/12/1990, 27 y.o.   MRN: [id number]  Hypertension  This is a chronic problem. The current episode started more than 1 month ago. There are no compliance problems.   PT was placed on Enalapril at last visit and states that the coughing is irritating. Pt states that coughing is a side effect.    Has developed a coug with the meds   no cks of b p elsewhere  Notes cough and fairly sig and aggravatign     Review of Systems No headache, no major weight loss or weight gain, no chest pain no back pain abdominal pain no change in bowel habits complete ROS otherwise negative     Objective:   Physical Exam  Alert vitals stable, NAD. Blood pressure good on repeat. HEENT normal. Lungs clear. Heart regular rate and rhythm.       Assessment & Plan:  Impression hypertension improved now good control.  Unfortunately side effects.  Discussed.  Stop enalapril.  Start Norvasc 5 mg daily follow-up in 6 months

## 2019-01-29 ENCOUNTER — Emergency Department (HOSPITAL_COMMUNITY)
Admission: EM | Admit: 2019-01-29 | Discharge: 2019-01-30 | Disposition: A | Discharge status: Home / Self Care | Payer: BLUE CROSS/BLUE SHIELD | Attending: Emergency Medicine | Admitting: Emergency Medicine

## 2019-01-29 ENCOUNTER — Encounter (HOSPITAL_COMMUNITY): Payer: Self-pay | Admitting: Emergency Medicine

## 2019-01-29 ENCOUNTER — Other Ambulatory Visit: Payer: Self-pay

## 2019-01-29 ENCOUNTER — Emergency Department (HOSPITAL_COMMUNITY): Payer: BLUE CROSS/BLUE SHIELD

## 2019-01-29 DIAGNOSIS — Y929 Unspecified place or not applicable: Secondary | ICD-10-CM | POA: Diagnosis not present

## 2019-01-29 DIAGNOSIS — Y999 Unspecified external cause status: Secondary | ICD-10-CM | POA: Insufficient documentation

## 2019-01-29 DIAGNOSIS — J45909 Unspecified asthma, uncomplicated: Secondary | ICD-10-CM | POA: Diagnosis not present

## 2019-01-29 DIAGNOSIS — Y9389 Activity, other specified: Secondary | ICD-10-CM | POA: Diagnosis not present

## 2019-01-29 DIAGNOSIS — I10 Essential (primary) hypertension: Secondary | ICD-10-CM | POA: Diagnosis not present

## 2019-01-29 DIAGNOSIS — S6392XA Sprain of unspecified part of left wrist and hand, initial encounter: Secondary | ICD-10-CM | POA: Diagnosis not present

## 2019-01-29 DIAGNOSIS — W010XXA Fall on same level from slipping, tripping and stumbling without subsequent striking against object, initial encounter: Secondary | ICD-10-CM | POA: Insufficient documentation

## 2019-01-29 DIAGNOSIS — S6981XA Other specified injuries of right wrist, hand and finger(s), initial encounter: Secondary | ICD-10-CM | POA: Diagnosis present

## 2019-01-29 DIAGNOSIS — F17210 Nicotine dependence, cigarettes, uncomplicated: Secondary | ICD-10-CM | POA: Insufficient documentation

## 2019-01-29 HISTORY — DX: Essential (primary) hypertension: I10

## 2019-01-29 MED ORDER — IBUPROFEN 400 MG PO TABS
400.0000 mg | ORAL_TABLET | Freq: Once | ORAL | Status: AC
  Administered 2019-01-29: 400 mg via ORAL
  Filled 2019-01-29: qty 1

## 2019-01-29 NOTE — ED Provider Notes (Signed)
[facility] EMERGENCY DEPARTMENT Provider Note   CSN: [id number] Arrival date & time: 01/29/19  2305    History   Chief Complaint Chief Complaint  Patient presents with  . Wrist Pain    HPI Thomas Heath is a 28 y.o. male.     The history is provided by the patient.  Wrist Pain  This is a new problem. The current episode started 2 days ago. The problem occurs constantly. The problem has been gradually worsening. Exacerbated by: movement of left hand/wrist. The symptoms are relieved by rest. Treatments tried: NSAIDs. The treatment provided mild relief.   Patient reports while jumping during a basketball game he fell down on his left wrist, with outstretched hand.  Since that time has had pain in his left hand and wrist with movement.  No deformities.  No elbow injury.  No other acute complaints No head injury reported Past Medical History:  Diagnosis Date  . Asthma   . Hypertension     Patient Active Problem List   Diagnosis Date Noted  . Essential hypertension 05/28/2018  . Medial meniscus, posterior horn derangement 10/15/2014  . ACL tear 09/24/2014  . DERANGEMENT MENISCUS 08/10/2008  . CRUCIATE SPRAIN 08/10/2008    Past Surgical History:  Procedure Laterality Date  . ANTERIOR CRUCIATE LIGAMENT REPAIR Left   . ANTERIOR CRUCIATE LIGAMENT REPAIR Left 10/30/2014   Procedure: RECONSTRUCTION ANTERIOR CRUCIATE LIGAMENT (ACL) LEFT, PATELLER- TENDON AUTOGRAFT, MEDIAL MENISECTOMY;  Surgeon: Vickki Hearing, MD;  Location: [facility];  Service: Orthopedics;  Laterality: Left;        Home Medications    Prior to Admission medications   Not on File    Family History No family history on file.  Social History Social History   Tobacco Use  . Smoking status: Current Every Day Smoker    Types: Cigars  . Smokeless tobacco: Never Used  Substance Use Topics  . Alcohol use: No  . Drug use: No     Allergies   Ace inhibitors   Review of Systems Review of  Systems  Musculoskeletal: Positive for arthralgias and joint swelling.  Neurological: Positive for numbness. Negative for weakness.       Reports mild tingling to left little finger      Physical Exam Updated Vital Signs BP (!) 154/89 (BP Location: Right Arm)   Pulse 81   Temp 98 F (36.7 C) (Oral)   Resp 15   SpO2 99%   Physical Exam  CONSTITUTIONAL: Well developed/well nourished HEAD: Normocephalic/atraumatic EYES: EOMI ENMT: Mucous membranes moist NECK: supple no meningeal signs CV: S1/S2 noted, no murmurs/rubs/gallops noted LUNGS: Lungs are clear to auscultation bilaterally, no apparent distress ABDOMEN: soft  NEURO: Pt is awake/alert/appropriate, moves all extremitiesx4.  No facial droop.   EXTREMITIES: pulses normal/equal, full ROM, mild tenderness noted to the left hand, full flexion-extension of left wrist without difficulty, no deformities, distal pulses intact, no snuffbox tenderness to left, he is able to make a fist with left hand without difficulty.  Equal handgrips noted SKIN: warm, color normal PSYCH: no abnormalities of mood noted, alert and oriented to situation  ED Treatments / Results  Labs (all labs ordered are listed, but only abnormal results are displayed) Labs Reviewed - No data to display  EKG None  Radiology Dg Hand Complete Left  Result Date: 01/30/2019 CLINICAL DATA:  Fall on wrist while playing basketball EXAM: LEFT HAND - COMPLETE 3+ VIEW COMPARISON:  None. FINDINGS: There is no evidence of fracture  or dislocation. There is no evidence of arthropathy or other focal bone abnormality. Soft tissues are unremarkable. IMPRESSION: Negative. Electronically Signed   By: Deatra Robinson M.D.   On: 01/30/2019 00:13    Procedures Procedures  Medications Ordered in ED Medications  ibuprofen (ADVIL,MOTRIN) tablet 400 mg (400 mg Oral Given 01/29/19 2349)     Initial Impression / Assessment and Plan / ED Course  I have reviewed the triage vital signs  and the nursing notes.  Pertinent  imaging results that were available during my care of the patient were reviewed by me and considered in my medical decision making (see chart for details).        X-rays negative for any acute bony injury.  He has no snuffbox tenderness to suggest scaphoid injury.  He has minimal tenderness on palpation, mostly with movement of the left wrist and hand.  Will place a removable splint for a week, follow-up with orthopedics if no improvement in 7 to 10 days  Final Clinical Impressions(s) / ED Diagnoses   Final diagnoses:  Hand sprain, left, initial encounter    ED Discharge Orders    None       Zadie Rhine, MD 01/30/19 [phone]

## 2019-01-29 NOTE — ED Triage Notes (Signed)
Pt states he fell on his left wrist playing basketball on Monday. PNo deformity noted.

## 2019-01-30 NOTE — Discharge Instructions (Signed)
If you have no improvement in your pain in a week please follow with Dr. Romeo Apple.  You can apply ice to your hand over the next several days to assist with swelling.  You can use the splint as needed for comfort for the next week

## 2019-08-05 ENCOUNTER — Emergency Department (HOSPITAL_COMMUNITY)
Admission: EM | Admit: 2019-08-05 | Discharge: 2019-08-05 | Disposition: A | Discharge status: Home / Self Care | Payer: BC Managed Care – PPO | Attending: Emergency Medicine | Admitting: Emergency Medicine

## 2019-08-05 ENCOUNTER — Other Ambulatory Visit: Payer: Self-pay

## 2019-08-05 ENCOUNTER — Encounter (HOSPITAL_COMMUNITY): Payer: Self-pay | Admitting: Emergency Medicine

## 2019-08-05 ENCOUNTER — Emergency Department (HOSPITAL_COMMUNITY): Payer: BC Managed Care – PPO

## 2019-08-05 DIAGNOSIS — J45909 Unspecified asthma, uncomplicated: Secondary | ICD-10-CM | POA: Diagnosis not present

## 2019-08-05 DIAGNOSIS — F17210 Nicotine dependence, cigarettes, uncomplicated: Secondary | ICD-10-CM | POA: Diagnosis not present

## 2019-08-05 DIAGNOSIS — I10 Essential (primary) hypertension: Secondary | ICD-10-CM | POA: Diagnosis not present

## 2019-08-05 DIAGNOSIS — Y939 Activity, unspecified: Secondary | ICD-10-CM | POA: Diagnosis not present

## 2019-08-05 DIAGNOSIS — S62346A Nondisplaced fracture of base of fifth metacarpal bone, right hand, initial encounter for closed fracture: Secondary | ICD-10-CM | POA: Insufficient documentation

## 2019-08-05 DIAGNOSIS — Y999 Unspecified external cause status: Secondary | ICD-10-CM | POA: Diagnosis not present

## 2019-08-05 DIAGNOSIS — Y929 Unspecified place or not applicable: Secondary | ICD-10-CM | POA: Diagnosis not present

## 2019-08-05 DIAGNOSIS — S6991XA Unspecified injury of right wrist, hand and finger(s), initial encounter: Secondary | ICD-10-CM | POA: Diagnosis present

## 2019-08-05 DIAGNOSIS — W228XXA Striking against or struck by other objects, initial encounter: Secondary | ICD-10-CM | POA: Insufficient documentation

## 2019-08-05 MED ORDER — OXYCODONE-ACETAMINOPHEN 5-325 MG PO TABS: 1.0000 | ORAL_TABLET | ORAL | 0 refills | Status: DC | PRN

## 2019-08-05 MED ORDER — AMLODIPINE BESYLATE 5 MG PO TABS: 5.0000 mg | ORAL_TABLET | Freq: Every day | ORAL | 0 refills | Status: DC

## 2019-08-05 MED ORDER — IBUPROFEN 800 MG PO TABS: 800.0000 mg | ORAL_TABLET | Freq: Three times a day (TID) | ORAL | 0 refills | Status: DC

## 2019-08-05 MED ORDER — OXYCODONE-ACETAMINOPHEN 5-325 MG PO TABS
1.0000 | ORAL_TABLET | Freq: Once | ORAL | Status: AC
  Administered 2019-08-05: 13:00:00 1 via ORAL
  Filled 2019-08-05: qty 1

## 2019-08-05 NOTE — Discharge Instructions (Addendum)
Keep your hand splinted.  Elevate your hand when possible.  You may use the sling as needed for support and comfort.  Call Dr. Ruthe Mannan office to arrange a follow-up appointment.  Follow-up with Dr. Wolfgang Phoenix regarding your high blood pressure

## 2019-08-05 NOTE — ED Provider Notes (Signed)
[facility] EMERGENCY DEPARTMENT Provider Note   CSN: [id number] Arrival date & time: 08/05/19  [id number]     History   Chief Complaint Chief Complaint  Patient presents with  . Hand Injury    HPI Thomas Heath is a 28 y.o. male.     HPI   Thomas Heath is a 28 y.o. male who presents to the Emergency Department complaining of pain and swelling of his right dorsal hand.  He states that he became upset yesterday and punched a door.  States he punched with a closed fist and began having pain and gradual swelling to his right lateral hand.  Pain is associated with palpation and when he attempts to make a fist.  He has not tried any therapies prior to arrival.  He denies any open wounds or pain of the forearm, wrist or elbow.  He does describe some tingling of his fifth finger.  He is left-hand dominant.   Past Medical History:  Diagnosis Date  . Asthma   . Hypertension     Patient Active Problem List   Diagnosis Date Noted  . Essential hypertension 05/28/2018  . Medial meniscus, posterior horn derangement 10/15/2014  . ACL tear 09/24/2014  . DERANGEMENT MENISCUS 08/10/2008  . CRUCIATE SPRAIN 08/10/2008    Past Surgical History:  Procedure Laterality Date  . ANTERIOR CRUCIATE LIGAMENT REPAIR Left   . ANTERIOR CRUCIATE LIGAMENT REPAIR Left 10/30/2014   Procedure: RECONSTRUCTION ANTERIOR CRUCIATE LIGAMENT (ACL) LEFT, PATELLER- TENDON AUTOGRAFT, MEDIAL MENISECTOMY;  Surgeon: Vickki HearingStanley E Harrison, MD;  Location: [facility];  Service: Orthopedics;  Laterality: Left;      Home Medications    Prior to Admission medications   Not on File    Family History History reviewed. No pertinent family history.  Social History Social History   Tobacco Use  . Smoking status: Current Every Day Smoker    Types: Cigars  . Smokeless tobacco: Never Used  Substance Use Topics  . Alcohol use: No  . Drug use: No     Allergies   Ace inhibitors   Review of Systems Review of Systems   Constitutional: Negative for chills and fever.  Musculoskeletal: Positive for arthralgias (pain, swelling right hand) and joint swelling. Negative for neck pain.  Skin: Negative for color change and wound.  Neurological: Positive for dizziness. Negative for weakness and numbness ("tingling" of the right fifth finger).     Physical Exam Updated Vital Signs BP (!) 178/104   Pulse 79   Resp 18   Ht 5\' 6"  (1.676 m)   Wt 86.2 kg   SpO2 100%   BMI 30.67 kg/m   Physical Exam Vitals signs and nursing note reviewed.  Constitutional:      Appearance: Normal appearance. He is not toxic-appearing.  HENT:     Head: Atraumatic.  Neck:     Musculoskeletal: Normal range of motion.  Cardiovascular:     Rate and Rhythm: Normal rate and regular rhythm.     Pulses: Normal pulses.  Pulmonary:     Effort: Pulmonary effort is normal.  Chest:     Chest wall: No tenderness.  Musculoskeletal:        General: Swelling, tenderness and signs of injury present.     Comments: Focal tenderness to palpation of the dorsal aspect of the lateral right hand.  Point tenderness noted at the base of the fifth metacarpal.  No bony deformity.  Right wrist is nontender.  No proximal edema.  Skin:  General: Skin is warm.     Capillary Refill: Capillary refill takes less than 2 seconds.     Findings: No erythema.  Neurological:     General: No focal deficit present.     Mental Status: He is alert.     Sensory: No sensory deficit.     Motor: No weakness.      ED Treatments / Results  Labs (all labs ordered are listed, but only abnormal results are displayed) Labs Reviewed - No data to display  EKG None  Radiology Dg Hand Complete Right  Result Date: 08/05/2019 CLINICAL DATA:  Pain after hitting door EXAM: RIGHT HAND - COMPLETE 3+ VIEW COMPARISON:  None. FINDINGS: Frontal, oblique, and lateral views were obtained. There is a comminuted fracture along the proximal aspect of the fifth metacarpal with  alignment near anatomic. Marked soft tissue swelling is noted in this area. There is a linear lucency in the lateral aspect of the distal radial metaphysis which is of uncertain etiology. It may represent a prominent nutrient foramen. An incomplete fracture in this area is possible, however. No other fractures are evident. No dislocation. Joint spaces appear normal. No erosive change. IMPRESSION: Comminuted fracture proximal fifth metacarpal with alignment near anatomic. Marked soft tissue swelling in this area. Question prominent nutrient foramen versus incomplete fracture along the lateral aspect of the distal radial metaphysis. No other fracture evident. No dislocation. No appreciable arthropathy. Electronically Signed   By: Lowella Grip III M.D.   On: 08/05/2019 10:37    Procedures Procedures (including critical care time)  Medications Ordered in ED Medications  oxyCODONE-acetaminophen (PERCOCET/ROXICET) 5-325 MG per tablet 1 tablet (has no administration in time range)     Initial Impression / Assessment and Plan / ED Course  I have reviewed the triage vital signs and the nursing notes.  Pertinent labs & imaging results that were available during my care of the patient were reviewed by me and considered in my medical decision making (see chart for details).        Patient with direct blow to a door yesterday.  X-ray shows fracture at the base of the fifth metacarpal with near anatomic alignment.  Neurovascularly intact.  No open wound.  Patient agrees to treatment plan with ulnar gutter splint, sling, elevation and ice.  He is requesting referral information for Dr. Aline Brochure.  SPLINT APPLICATION Date/Time: 6:26 AM Authorized by: Aleana Fifita Consent: Verbal consent obtained. Risks and benefits: risks, benefits and alternatives were discussed Consent given by: patient Splint applied by: nursing Location details: right lateral hand Splint type: ulnar gutter Supplies used:  ortho-glass, gauze, ACE wrap  Post-procedure: The splinted body part was neurovascularly unchanged following the procedure. Patient tolerance: Patient tolerated the procedure well with no immediate complications.   Pt noted to be hypertensive, has hx of same.  He is not taking his BP medication due to side affects.  rx written for Norvasc.  He is currently asymptomatic.  Agrees to arrange PCP f/u  Final Clinical Impressions(s) / ED Diagnoses   Final diagnoses:  Closed nondisplaced fracture of base of fifth metacarpal bone of right hand, initial encounter  Hypertension, unspecified type    ED Discharge Orders    None       Kem Parkinson, PA-C 08/07/19 1002    Milton Ferguson, MD 08/08/19 1103

## 2019-08-05 NOTE — ED Triage Notes (Signed)
Pt punched a door yesterday.  Injury to right hand with pain and swelling.

## 2019-08-25 ENCOUNTER — Emergency Department (HOSPITAL_COMMUNITY): Payer: Self-pay

## 2019-08-25 ENCOUNTER — Emergency Department (HOSPITAL_COMMUNITY)
Admission: EM | Admit: 2019-08-25 | Discharge: 2019-08-25 | Disposition: A | Discharge status: Home / Self Care | Payer: Self-pay | Attending: Emergency Medicine | Admitting: Emergency Medicine

## 2019-08-25 ENCOUNTER — Other Ambulatory Visit: Payer: Self-pay

## 2019-08-25 DIAGNOSIS — I10 Essential (primary) hypertension: Secondary | ICD-10-CM | POA: Insufficient documentation

## 2019-08-25 DIAGNOSIS — L03114 Cellulitis of left upper limb: Secondary | ICD-10-CM | POA: Insufficient documentation

## 2019-08-25 DIAGNOSIS — F17210 Nicotine dependence, cigarettes, uncomplicated: Secondary | ICD-10-CM | POA: Insufficient documentation

## 2019-08-25 DIAGNOSIS — J45909 Unspecified asthma, uncomplicated: Secondary | ICD-10-CM | POA: Insufficient documentation

## 2019-08-25 LAB — CBC WITH DIFFERENTIAL/PLATELET
Abs Immature Granulocytes: 0.07 10*3/uL (ref 0.00–0.07)
Basophils Absolute: 0.1 10*3/uL (ref 0.0–0.1)
Basophils Relative: 1 %
Eosinophils Absolute: 0.3 10*3/uL (ref 0.0–0.5)
Eosinophils Relative: 2 %
HCT: 41.1 % (ref 39.0–52.0)
Hemoglobin: 13.8 g/dL (ref 13.0–17.0)
Immature Granulocytes: 1 %
Lymphocytes Relative: 28 %
Lymphs Abs: 3.4 10*3/uL (ref 0.7–4.0)
MCH: 32.1 pg (ref 26.0–34.0)
MCHC: 33.6 g/dL (ref 30.0–36.0)
MCV: 95.6 fL (ref 80.0–100.0)
Monocytes Absolute: 0.8 10*3/uL (ref 0.1–1.0)
Monocytes Relative: 7 %
Neutro Abs: 7.6 10*3/uL (ref 1.7–7.7)
Neutrophils Relative %: 61 %
Platelets: 306 10*3/uL (ref 150–400)
RBC: 4.3 MIL/uL (ref 4.22–5.81)
RDW: 12.2 % (ref 11.5–15.5)
WBC: 12.2 10*3/uL — ABNORMAL HIGH (ref 4.0–10.5)
nRBC: 0 % (ref 0.0–0.2)

## 2019-08-25 LAB — COMPREHENSIVE METABOLIC PANEL
ALT: 14 U/L (ref 0–44)
AST: 16 U/L (ref 15–41)
Albumin: 4.2 g/dL (ref 3.5–5.0)
Alkaline Phosphatase: 81 U/L (ref 38–126)
Anion gap: 10 (ref 5–15)
BUN: 12 mg/dL (ref 6–20)
CO2: 23 mmol/L (ref 22–32)
Calcium: 9.3 mg/dL (ref 8.9–10.3)
Chloride: 105 mmol/L (ref 98–111)
Creatinine, Ser: 0.91 mg/dL (ref 0.61–1.24)
GFR calc Af Amer: >60 mL/min (ref >60)
GFR calc non Af Amer: >60 mL/min (ref >60)
Glucose, Bld: 90 mg/dL (ref 70–99)
Potassium: 4 mmol/L (ref 3.5–5.1)
Sodium: 138 mmol/L (ref 135–145)
Total Bilirubin: 0.9 mg/dL (ref 0.3–1.2)
Total Protein: 7.8 g/dL (ref 6.5–8.1)

## 2019-08-25 MED ORDER — CEPHALEXIN 500 MG PO CAPS: 500.0000 mg | ORAL_CAPSULE | Freq: Four times a day (QID) | ORAL | 0 refills | Status: AC

## 2019-08-25 MED ORDER — DOXYCYCLINE HYCLATE 100 MG PO CAPS: 100.0000 mg | ORAL_CAPSULE | Freq: Two times a day (BID) | ORAL | 0 refills | Status: DC

## 2019-08-25 MED ORDER — SODIUM CHLORIDE 0.9 % IV SOLN
1.0000 g | Freq: Once | INTRAVENOUS | Status: AC
  Administered 2019-08-25: 13:00:00 1 g via INTRAVENOUS
  Filled 2019-08-25: qty 10

## 2019-08-25 NOTE — ED Provider Notes (Signed)
[facility] EMERGENCY DEPARTMENT Provider Note   CSN: [id number] Arrival date & time: 08/25/19  1041     History   Chief Complaint Chief Complaint  Patient presents with  . Arm Pain    HPI Thomas Heath is a 28 y.o. male.     The history is provided by the patient. No language interpreter was used.  Arm Pain The current episode started more than 1 week ago. The problem has been gradually worsening. Nothing aggravates the symptoms. Nothing relieves the symptoms. He has tried nothing for the symptoms. The treatment provided no relief.  Pt reports he got a tatoo several weeks ago.  Pt complains of redness and swelling to his arm.  Pt reports area is draining.  Pt states his friend opened area  Past Medical History:  Diagnosis Date  . Asthma   . Hypertension     Patient Active Problem List   Diagnosis Date Noted  . Essential hypertension 05/28/2018  . Medial meniscus, posterior horn derangement 10/15/2014  . ACL tear 09/24/2014  . DERANGEMENT MENISCUS 08/10/2008  . CRUCIATE SPRAIN 08/10/2008    Past Surgical History:  Procedure Laterality Date  . ANTERIOR CRUCIATE LIGAMENT REPAIR Left   . ANTERIOR CRUCIATE LIGAMENT REPAIR Left 10/30/2014   Procedure: RECONSTRUCTION ANTERIOR CRUCIATE LIGAMENT (ACL) LEFT, PATELLER- TENDON AUTOGRAFT, MEDIAL MENISECTOMY;  Surgeon: Carole Civil, MD;  Location: [facility];  Service: Orthopedics;  Laterality: Left;        Home Medications    Prior to Admission medications   Medication Sig Start Date End Date Taking? Authorizing Provider  amLODipine (NORVASC) 5 MG tablet Take 1 tablet (5 mg total) by mouth daily. 08/05/19   Triplett, Tammy, PA-C  ibuprofen (ADVIL) 800 MG tablet Take 1 tablet (800 mg total) by mouth 3 (three) times daily. Take with food 08/05/19   Triplett, Tammy, PA-C  oxyCODONE-acetaminophen (PERCOCET/ROXICET) 5-325 MG tablet Take 1 tablet by mouth every 4 (four) hours as needed. 08/05/19   Triplett, Lynelle Smoke, PA-C    Family  History No family history on file.  Social History Social History   Tobacco Use  . Smoking status: Current Every Day Smoker    Types: Cigars  . Smokeless tobacco: Never Used  Substance Use Topics  . Alcohol use: No  . Drug use: No     Allergies   Ace inhibitors   Review of Systems Review of Systems  All other systems reviewed and are negative.    Physical Exam Updated Vital Signs BP (!) 158/93 (BP Location: Right Arm)   Pulse 88   Temp 98.9 F (37.2 C) (Oral)   Resp 18   Ht 5\' 6"  (1.676 m)   Wt 81.6 kg   SpO2 100%   BMI 29.05 kg/m   Physical Exam Vitals signs and nursing note reviewed.  HENT:     Head: Normocephalic.  Neck:     Musculoskeletal: Normal range of motion.  Cardiovascular:     Rate and Rhythm: Normal rate.  Pulmonary:     Effort: Pulmonary effort is normal.  Musculoskeletal:        General: Swelling and tenderness present.  Skin:    General: Skin is warm.  Neurological:     General: No focal deficit present.     Mental Status: He is alert.  Psychiatric:        Mood and Affect: Mood normal.      ED Treatments / Results  Labs (all labs ordered are listed, but only  abnormal results are displayed) Labs Reviewed  CBC WITH DIFFERENTIAL/PLATELET - Abnormal; Notable for the following components:      Result Value   WBC 12.2 (*)    All other components within normal limits  AEROBIC CULTURE (SUPERFICIAL SPECIMEN)  COMPREHENSIVE METABOLIC PANEL    EKG None  Radiology Dg Forearm Left  Result Date: 08/25/2019 CLINICAL DATA:  Infected tattoo. EXAM: LEFT FOREARM - 2 VIEW COMPARISON:  None. FINDINGS: Soft tissue swelling is identified in the arm consistent with history. No soft tissue gas or foreign body is noted. No joint effusion, fracture, dislocation, or bony erosion. IMPRESSION: Soft tissue swelling.  No bony abnormality noted. Electronically Signed   By: Gerome Sam III M.D   On: 08/25/2019 14:12    Procedures Procedures  (including critical care time)  Medications Ordered in ED Medications  cefTRIAXone (ROCEPHIN) 1 g in sodium chloride 0.9 % 100 mL IVPB (0 g Intravenous Stopped 08/25/19 1336)     Initial Impression / Assessment and Plan / ED Course  I have reviewed the triage vital signs and the nursing notes.  Pertinent labs & imaging results that were available during my care of the patient were reviewed by me and considered in my medical decision making (see chart for details).        MDM   Xray and lab obtained.   Pt given Rocephin IV.   Wound bandaged.  Pt given rx for keflex and doxycycline.   Pt advised to soak area and recheck here in 24 hours.   Final Clinical Impressions(s) / ED Diagnoses   Final diagnoses:  Cellulitis of left arm    ED Discharge Orders         Ordered    cephALEXin (KEFLEX) 500 MG capsule  4 times daily     08/25/19 1434    doxycycline (VIBRAMYCIN) 100 MG capsule  2 times daily     08/25/19 1434        An After Visit Summary was printed and given to the patient.    Elson Areas, [place] 08/25/19 1435    Geoffery Lyons, MD 08/28/19 [phone]

## 2019-08-25 NOTE — Discharge Instructions (Signed)
Return if any problems. Return here tomorrow at 12 noon for recheck

## 2019-08-25 NOTE — ED Triage Notes (Signed)
Pt states that he has a infected tattoo on his left forearm he got it 8 weeks ago

## 2019-08-26 ENCOUNTER — Other Ambulatory Visit: Payer: Self-pay

## 2019-08-26 ENCOUNTER — Encounter (HOSPITAL_COMMUNITY): Payer: Self-pay | Admitting: *Deleted

## 2019-08-26 ENCOUNTER — Emergency Department (HOSPITAL_COMMUNITY)
Admission: EM | Admit: 2019-08-26 | Discharge: 2019-08-26 | Disposition: A | Discharge status: Home / Self Care | Payer: Self-pay | Attending: Emergency Medicine | Admitting: Emergency Medicine

## 2019-08-26 DIAGNOSIS — Z5189 Encounter for other specified aftercare: Secondary | ICD-10-CM

## 2019-08-26 DIAGNOSIS — Z4800 Encounter for change or removal of nonsurgical wound dressing: Secondary | ICD-10-CM | POA: Insufficient documentation

## 2019-08-26 DIAGNOSIS — I10 Essential (primary) hypertension: Secondary | ICD-10-CM | POA: Insufficient documentation

## 2019-08-26 DIAGNOSIS — J45909 Unspecified asthma, uncomplicated: Secondary | ICD-10-CM | POA: Insufficient documentation

## 2019-08-26 DIAGNOSIS — F17210 Nicotine dependence, cigarettes, uncomplicated: Secondary | ICD-10-CM | POA: Insufficient documentation

## 2019-08-26 DIAGNOSIS — Z79899 Other long term (current) drug therapy: Secondary | ICD-10-CM | POA: Insufficient documentation

## 2019-08-26 MED ORDER — SODIUM CHLORIDE 0.9 % IV SOLN
1.0000 g | Freq: Once | INTRAVENOUS | Status: AC
  Administered 2019-08-26: 1 g via INTRAVENOUS
  Filled 2019-08-26: qty 10

## 2019-08-26 NOTE — ED Triage Notes (Signed)
Seen yesterday for arm infection, here for recheck

## 2019-08-26 NOTE — ED Provider Notes (Addendum)
[facility] EMERGENCY DEPARTMENT Provider Note   CSN: [id number] Arrival date & time: 08/26/19  1202     History   Chief Complaint Chief Complaint  Patient presents with  . Wound Check    HPI Thomas Heath is a 28 y.o. male.     Pt here for recheck.  Pt seen by me yesterday and given Iv rocephin and oral antibiotics.  Pt here for recheck and repeat dosage of ancef.  Pt reports less swelling and less pain   Pt reports wound has been draining   The history is provided by the patient. No language interpreter was used.  Wound Check This is a recurrent problem. The current episode started more than 1 week ago. The problem occurs constantly.    Past Medical History:  Diagnosis Date  . Asthma   . Hypertension     Patient Active Problem List   Diagnosis Date Noted  . Essential hypertension 05/28/2018  . Medial meniscus, posterior horn derangement 10/15/2014  . ACL tear 09/24/2014  . DERANGEMENT MENISCUS 08/10/2008  . CRUCIATE SPRAIN 08/10/2008    Past Surgical History:  Procedure Laterality Date  . ANTERIOR CRUCIATE LIGAMENT REPAIR Left   . ANTERIOR CRUCIATE LIGAMENT REPAIR Left 10/30/2014   Procedure: RECONSTRUCTION ANTERIOR CRUCIATE LIGAMENT (ACL) LEFT, PATELLER- TENDON AUTOGRAFT, MEDIAL MENISECTOMY;  Surgeon: Vickki Hearing, MD;  Location: [facility];  Service: Orthopedics;  Laterality: Left;        Home Medications    Prior to Admission medications   Medication Sig Start Date End Date Taking? Authorizing Provider  amLODipine (NORVASC) 5 MG tablet Take 1 tablet (5 mg total) by mouth daily. 08/05/19   Triplett, Tammy, PA-C  cephALEXin (KEFLEX) 500 MG capsule Take 1 capsule (500 mg total) by mouth 4 (four) times daily for 10 days. 08/25/19 09/04/19  Elson Areas, PA-C  doxycycline (VIBRAMYCIN) 100 MG capsule Take 1 capsule (100 mg total) by mouth 2 (two) times daily. 08/25/19   Elson Areas, PA-C  ibuprofen (ADVIL) 800 MG tablet Take 1 tablet (800 mg total) by  mouth 3 (three) times daily. Take with food 08/05/19   Triplett, Tammy, PA-C  oxyCODONE-acetaminophen (PERCOCET/ROXICET) 5-325 MG tablet Take 1 tablet by mouth every 4 (four) hours as needed. 08/05/19   Triplett, Babette Relic, PA-C    Family History No family history on file.  Social History Social History   Tobacco Use  . Smoking status: Current Every Day Smoker    Types: Cigars  . Smokeless tobacco: Never Used  Substance Use Topics  . Alcohol use: No  . Drug use: No     Allergies   Ace inhibitors   Review of Systems Review of Systems  Musculoskeletal: Positive for myalgias.  All other systems reviewed and are negative.    Physical Exam Updated Vital Signs BP 116/65   Pulse 79   Temp 97.9 F (36.6 C) (Oral)   Resp 18   SpO2 100%   Physical Exam Vitals signs reviewed.  Musculoskeletal:        General: Swelling and tenderness present.     Comments: Drainage from 2 open wounds left arm,  Decreased redness, decreased swelling   Skin:    General: Skin is warm.  Neurological:     General: No focal deficit present.  Psychiatric:        Mood and Affect: Mood normal.      ED Treatments / Results  Labs (all labs ordered are listed, but only abnormal results  are displayed) Labs Reviewed - No data to display  EKG None  Radiology Dg Forearm Left  Result Date: 08/25/2019 CLINICAL DATA:  Infected tattoo. EXAM: LEFT FOREARM - 2 VIEW COMPARISON:  None. FINDINGS: Soft tissue swelling is identified in the arm consistent with history. No soft tissue gas or foreign body is noted. No joint effusion, fracture, dislocation, or bony erosion. IMPRESSION: Soft tissue swelling.  No bony abnormality noted. Electronically Signed   By: Dorise Bullion III M.D   On: 08/25/2019 14:12    Procedures Procedures (including critical care time)  Medications Ordered in ED Medications  cefTRIAXone (ROCEPHIN) 1 g in sodium chloride 0.9 % 100 mL IVPB (0 g Intravenous Stopped 08/26/19 1400)      Initial Impression / Assessment and Plan / ED Course  I have reviewed the triage vital signs and the nursing notes.  Pertinent labs & imaging results that were available during my care of the patient were reviewed by me and considered in my medical decision making (see chart for details).        MDM  Pt given rocephin 1 gram IV.  Pt advised to continue medication. Wound is healing well and is open .  I counseled pt and advised that he should return if not continuing to improve   Final Clinical Impressions(s) / ED Diagnoses   Final diagnoses:  Abscess re-check    ED Discharge Orders    None       Sidney Ace 08/26/19 1457    Fransico Meadow, [place] 08/26/19 1458    Hayden Rasmussen, MD 08/26/19 [phone]

## 2019-08-26 NOTE — Discharge Instructions (Addendum)
Return for recheck if symptoms not improving.   Continue antibiotics.  Continue wound care

## 2019-08-27 LAB — AEROBIC CULTURE W GRAM STAIN (SUPERFICIAL SPECIMEN): Special Requests: NORMAL

## 2019-08-28 ENCOUNTER — Telehealth: Payer: Self-pay | Admitting: *Deleted

## 2019-08-28 NOTE — Telephone Encounter (Signed)
Post ED Visit - Positive Culture Follow-up  Culture report reviewed by antimicrobial stewardship pharmacist: [place] Team []  Elenor Quinones, Pharm.D. [x]  Heide Guile, Pharm.D., BCPS AQ-ID []  Parks Neptune, Pharm.D., BCPS []  Alycia Rossetti, Pharm.D., BCPS []  [place], Pharm.D., BCPS, AAHIVP []  Legrand Como, Pharm.D., BCPS, AAHIVP []  Salome Arnt, PharmD, BCPS []  Johnnette Gourd, PharmD, BCPS []  Hughes Better, PharmD, BCPS []  Leeroy Cha, PharmD []  Laqueta Linden, PharmD, BCPS []  Albertina Parr, PharmD  [place] Team []  Leodis Sias, PharmD []  Lindell Spar, PharmD []  Royetta Asal, PharmD []  Graylin Shiver, Rph []  Rema Fendt) Glennon Mac, PharmD []  Arlyn Dunning, PharmD []  Netta Cedars, PharmD []  Dia Sitter, PharmD []  Leone Haven, PharmD []  Gretta Arab, PharmD []  Theodis Shove, PharmD []  Peggyann Juba, PharmD []  Reuel Boom, PharmD   Positive wound culture Treated with Cephalexin, organism sensitive to the same and no further patient follow-up is required at this time.  Harlon Flor [facility] 08/28/2019, 9:51 AM

## 2019-12-29 ENCOUNTER — Encounter: Payer: Self-pay | Admitting: Family Medicine

## 2020-01-28 ENCOUNTER — Other Ambulatory Visit: Payer: Self-pay

## 2020-01-28 ENCOUNTER — Ambulatory Visit: Payer: BC Managed Care – PPO | Attending: Internal Medicine

## 2020-01-28 DIAGNOSIS — Z20822 Contact with and (suspected) exposure to covid-19: Secondary | ICD-10-CM | POA: Insufficient documentation

## 2020-01-29 LAB — NOVEL CORONAVIRUS, NAA: SARS-CoV-2, NAA: NOT DETECTED

## 2020-02-24 ENCOUNTER — Emergency Department (HOSPITAL_COMMUNITY)
Admission: EM | Admit: 2020-02-24 | Discharge: 2020-02-24 | Disposition: A | Discharge status: Home / Self Care | Payer: BC Managed Care – PPO | Attending: Emergency Medicine | Admitting: Emergency Medicine

## 2020-02-24 ENCOUNTER — Other Ambulatory Visit: Payer: Self-pay

## 2020-02-24 ENCOUNTER — Emergency Department (HOSPITAL_COMMUNITY): Payer: BC Managed Care – PPO

## 2020-02-24 ENCOUNTER — Encounter (HOSPITAL_COMMUNITY): Payer: Self-pay | Admitting: *Deleted

## 2020-02-24 DIAGNOSIS — S99911A Unspecified injury of right ankle, initial encounter: Secondary | ICD-10-CM | POA: Diagnosis present

## 2020-02-24 DIAGNOSIS — Y9367 Activity, basketball: Secondary | ICD-10-CM | POA: Diagnosis not present

## 2020-02-24 DIAGNOSIS — Y999 Unspecified external cause status: Secondary | ICD-10-CM | POA: Diagnosis not present

## 2020-02-24 DIAGNOSIS — X58XXXA Exposure to other specified factors, initial encounter: Secondary | ICD-10-CM | POA: Diagnosis not present

## 2020-02-24 DIAGNOSIS — F17290 Nicotine dependence, other tobacco product, uncomplicated: Secondary | ICD-10-CM | POA: Diagnosis not present

## 2020-02-24 DIAGNOSIS — Y929 Unspecified place or not applicable: Secondary | ICD-10-CM | POA: Diagnosis not present

## 2020-02-24 DIAGNOSIS — M25571 Pain in right ankle and joints of right foot: Secondary | ICD-10-CM | POA: Diagnosis not present

## 2020-02-24 MED ORDER — NAPROXEN 500 MG PO TABS: 500.0000 mg | ORAL_TABLET | Freq: Two times a day (BID) | ORAL | 0 refills | Status: AC

## 2020-02-24 MED ORDER — ACETAMINOPHEN 325 MG PO TABS
650.0000 mg | ORAL_TABLET | Freq: Once | ORAL | Status: AC
  Administered 2020-02-24: 650 mg via ORAL
  Filled 2020-02-24: qty 2

## 2020-02-24 NOTE — Discharge Instructions (Addendum)
Please read and follow all provided instructions.  You have been seen today for a right ankle injury.   Tests performed today include: An x-ray of the affected area - does NOT show any broken bones or dislocations.  Vital signs. See below for your results today.   Home care instructions: -- *PRICE in the first 24-48 hours after injury: Protect (with brace, splint, sling), if given by your provider Rest Ice- Do not apply ice pack directly to your skin, place towel or similar between your skin and ice/ice pack. Apply ice for 20 min, then remove for 40 min while awake Compression- Wear brace, elastic bandage, splint as directed by your provider Elevate affected extremity above the level of your heart when not walking around for the first 24-48 hours   Medications:  - Naproxen is a nonsteroidal anti-inflammatory medication that will help with pain and swelling. Be sure to take this medication as prescribed with food, 1 pill every 12 hours,  It should be taken with food, as it can cause stomach upset, and more seriously, stomach bleeding. Do not take other nonsteroidal anti-inflammatory medications with this such as Advil, Motrin, Aleve, Mobic, Goodie Powder, or Motrin.    You make take Tylenol per over the counter dosing with these medications.   We have prescribed you new medication(s) today. Discuss the medications prescribed today with your pharmacist as they can have adverse effects and interactions with your other medicines including over the counter and prescribed medications. Seek medical evaluation if you start to experience new or abnormal symptoms after taking one of these medicines, seek care immediately if you start to experience difficulty breathing, feeling of your throat closing, facial swelling, or rash as these could be indications of a more serious allergic reaction   Follow-up instructions: Please follow-up with your primary care provider or the provided orthopedic physician  (bone specialist) if you continue to have significant pain in 1 week. In this case you may have a more severe injury that requires further care.   Return instructions:  Please return if your digits or extremity are numb or tingling, appear gray or blue, or you have severe pain (also elevate the extremity and loosen splint or wrap if you were given one) Please return if you have redness or fevers.  Please return to the Emergency Department if you experience worsening symptoms.  Please return if you have any other emergent concerns. Additional Information:  Your vital signs today were: BP (!) 158/101 (BP Location: Right Arm)   Pulse 81   Temp (!) 97.5 F (36.4 C) (Temporal)   Resp 18   Ht 5\' 6"  (1.676 m)   Wt 86.2 kg   SpO2 100%   BMI 30.67 kg/m  If your blood pressure (BP) was elevated above 135/85 this visit, please have this repeated by your doctor within one month. ---------------

## 2020-02-24 NOTE — ED Provider Notes (Signed)
[facility] EMERGENCY DEPARTMENT Provider Note   CSN: [id number] Arrival date & time: 02/24/20  2052     History Chief Complaint  Patient presents with  . Ankle Pain    right    Thomas Heath is a 29 y.o. male with a history of asthma and hypertension who presents to the emergency department with complaints of right ankle injury which occurred earlier this evening.  Patient states that he was playing basketball when he rolled the ankle.  States he has been having pain since injury.  It is constant, 10 out of 10, worse with attempts to move it and attempts to bear weight, he has not been able to put weight on it.  Took ibuprofen prior to arrival without much change.  Denies numbness, tingling, weakness, or other areas of injury.  He has rolled his ankle in the past, has never had any broken bones or required surgery.  HPI     Past Medical History:  Diagnosis Date  . Asthma   . Hypertension     Patient Active Problem List   Diagnosis Date Noted  . Essential hypertension 05/28/2018  . Medial meniscus, posterior horn derangement 10/15/2014  . ACL tear 09/24/2014  . DERANGEMENT MENISCUS 08/10/2008  . CRUCIATE SPRAIN 08/10/2008    Past Surgical History:  Procedure Laterality Date  . ANTERIOR CRUCIATE LIGAMENT REPAIR Left   . ANTERIOR CRUCIATE LIGAMENT REPAIR Left 10/30/2014   Procedure: RECONSTRUCTION ANTERIOR CRUCIATE LIGAMENT (ACL) LEFT, PATELLER- TENDON AUTOGRAFT, MEDIAL MENISECTOMY;  Surgeon: Vickki Hearing, MD;  Location: [facility];  Service: Orthopedics;  Laterality: Left;       History reviewed. No pertinent family history.  Social History   Tobacco Use  . Smoking status: Current Every Day Smoker    Types: Cigars  . Smokeless tobacco: Never Used  Substance Use Topics  . Alcohol use: No  . Drug use: No    Home Medications Prior to Admission medications   Medication Sig Start Date End Date Taking? Authorizing Provider  ibuprofen (ADVIL) 200 MG tablet Take  800 mg by mouth daily as needed for mild pain or moderate pain.   Yes [provider]    Allergies    Ace inhibitors  Review of Systems   Review of Systems  Constitutional: Negative for chills and fever.  Respiratory: Negative for shortness of breath.   Cardiovascular: Negative for chest pain.  Gastrointestinal: Negative for abdominal pain, nausea and vomiting.  Musculoskeletal: Positive for arthralgias.  Neurological: Negative for syncope, weakness and numbness.    Physical Exam Updated Vital Signs BP (!) 158/101 (BP Location: Right Arm)   Pulse 81   Temp (!) 97.5 F (36.4 C) (Temporal)   Resp 18   Ht 5\' 6"  (1.676 m)   Wt 86.2 kg   SpO2 100%   BMI 30.67 kg/m   Physical Exam Vitals and nursing note reviewed.  Constitutional:      General: He is not in acute distress.    Appearance: He is not ill-appearing or toxic-appearing.  HENT:     Head: Normocephalic and atraumatic.  Cardiovascular:     Pulses:          Dorsalis pedis pulses are 2+ on the right side and 2+ on the left side.       Posterior tibial pulses are 2+ on the right side and 2+ on the left side.  Pulmonary:     Effort: Pulmonary effort is normal.  Musculoskeletal:  Comments: Lower extremities: No obvious deformity, appreciable swelling, edema, erythema, ecchymosis, warmth, or open wounds. Patient has intact AROM to bilateral hips, knees, ankles, and all digits.  Right lower extremityRight lower extremity: patient is tender to palpation to the right lateral malleolus, lateral & medial ankle ligaments, and to the diffuse midfoot. Otherwise nontender. No fibular head tenderness.   Skin:    General: Skin is warm and dry.     Capillary Refill: Capillary refill takes less than 2 seconds.  Neurological:     Mental Status: He is alert.     Comments: Alert. Clear speech. Sensation grossly intact to bilateral lower extremities. 5/5 strength with plantar/dorsiflexion bilaterally.   Psychiatric:         Mood and Affect: Mood normal.        Behavior: Behavior normal.     ED Results / Procedures / Treatments   Labs (all labs ordered are listed, but only abnormal results are displayed) Labs Reviewed - No data to display  EKG None  Radiology DG Ankle Complete Right  Result Date: 02/24/2020 CLINICAL DATA:  Ankle injury EXAM: RIGHT ANKLE - COMPLETE 3+ VIEW COMPARISON:  04/15/2007 FINDINGS: No acute displaced fracture or malalignment. Ankle mortise is symmetric. Mild soft tissue swelling IMPRESSION: No acute osseous abnormality Electronically Signed   By: Donavan Foil M.D.   On: 02/24/2020 21:45   DG Foot Complete Right  Result Date: 02/24/2020 CLINICAL DATA:  Foot pain EXAM: RIGHT FOOT COMPLETE - 3+ VIEW COMPARISON:  None. FINDINGS: There is no evidence of fracture or dislocation. There is no evidence of arthropathy or other focal bone abnormality. Soft tissues are unremarkable. IMPRESSION: Negative. Electronically Signed   By: Donavan Foil M.D.   On: 02/24/2020 21:46    Procedures Procedures (including critical care time)  Medications Ordered in ED Medications  acetaminophen (TYLENOL) tablet 650 mg (has no administration in time range)    ED Course  I have reviewed the triage vital signs and the nursing notes.  Pertinent labs & imaging results that were available during my care of the patient were reviewed by me and considered in my medical decision making (see chart for details).    MDM Rules/Calculators/A&P                      Patient presents to the ED with complaints of pain to the  R ankle pain s/p injury this evening. Exam without obvious deformity or open wounds. ROM intact. Tender to palpation to the medial/lateral ankle ligaments as well as to the lateral malleolus and midfoot. NVI distally. Xray negative for fracture/dislocation. Therapeutic splint provided. PRICE recommended. Naproxen prescription. Patient has crutches with him to utilize as needed. I discussed  results, treatment plan, need for follow-up, and return precautions with the patient. Provided opportunity for questions, patient confirmed understanding and are in agreement with plan.   Final Clinical Impression(s) / ED Diagnoses Final diagnoses:  Injury of right ankle, initial encounter    Rx / DC Orders ED Discharge Orders         Ordered    naproxen (NAPROSYN) 500 MG tablet  2 times daily     02/24/20 2152           Leafy Kindle 02/24/20 2154    Virgel Manifold, MD 02/25/20 1826

## 2020-02-24 NOTE — ED Triage Notes (Signed)
Pt with right ankle pain after twisting it while playing basketball earlier today. Unable to put weight on it.

## 2020-05-30 IMAGING — DX DG ANKLE COMPLETE 3+V*R*
3 series · 3 of 3 positions shown · non-contrast
Comparison: 04/15/2007

CLINICAL DATA: Ankle injury

EXAM:
RIGHT ANKLE - COMPLETE 3+ VIEW

[ankle ap]
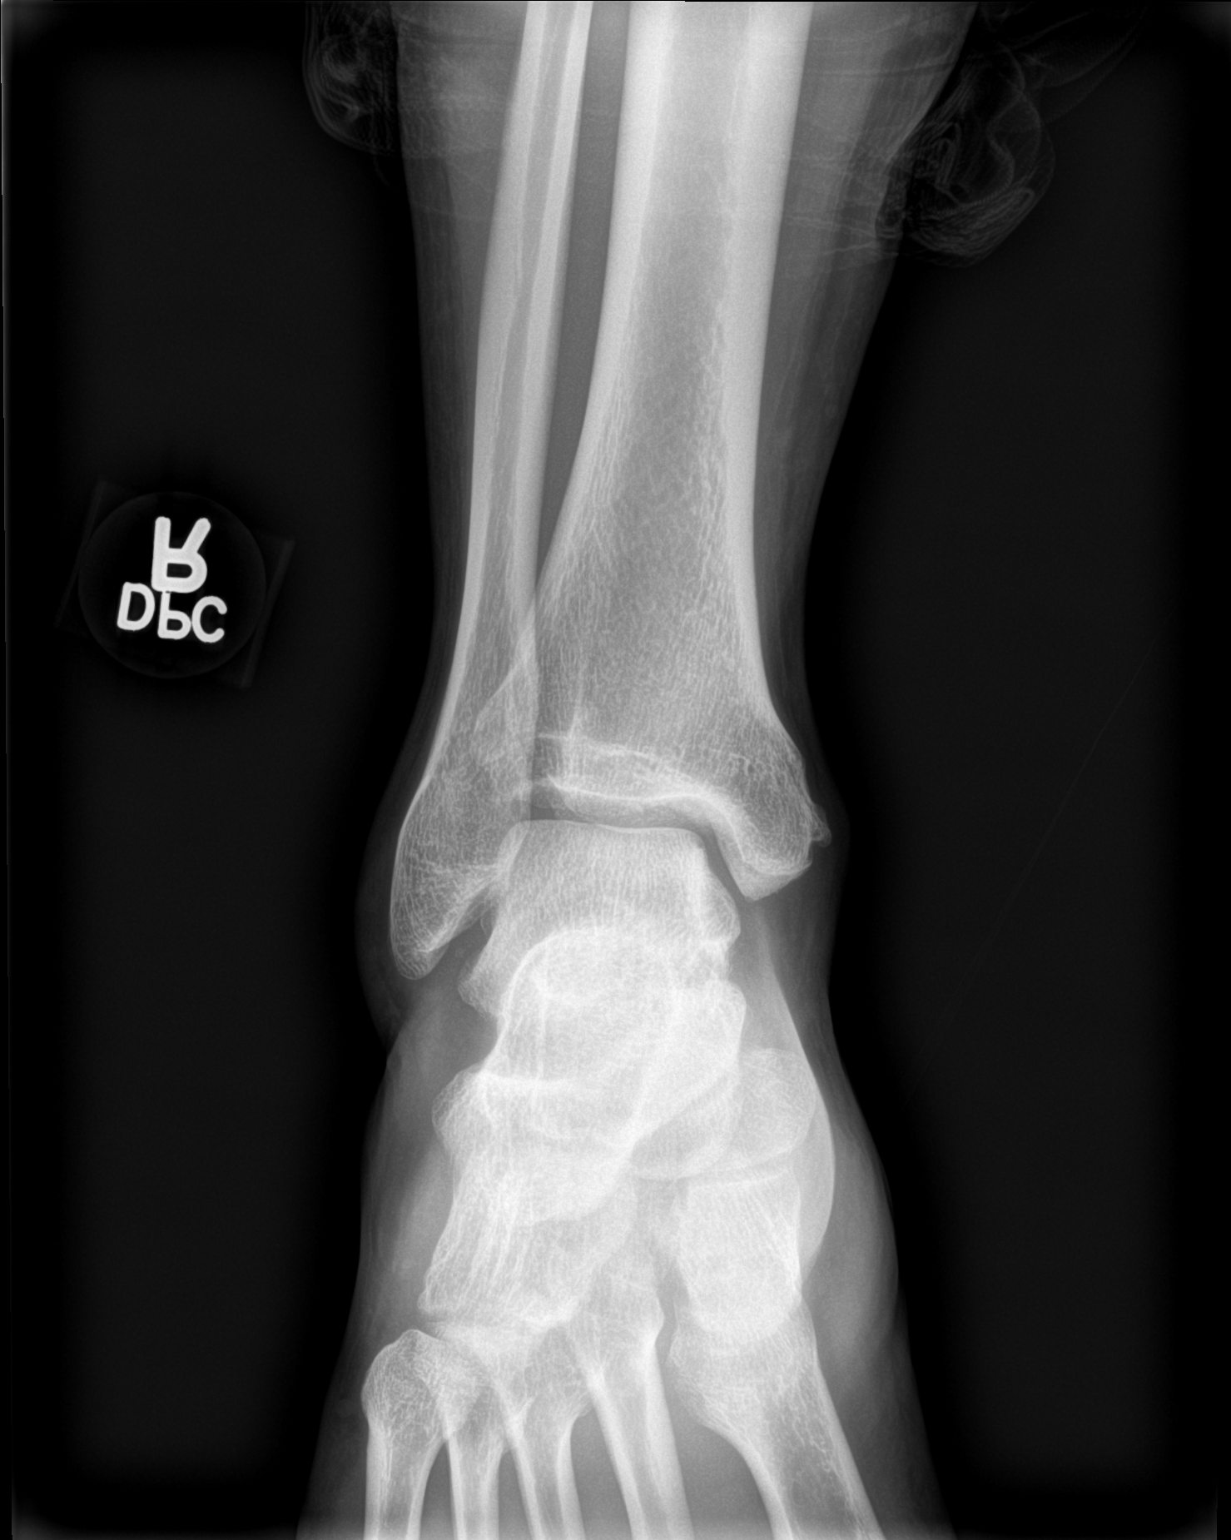

[ankle obl]
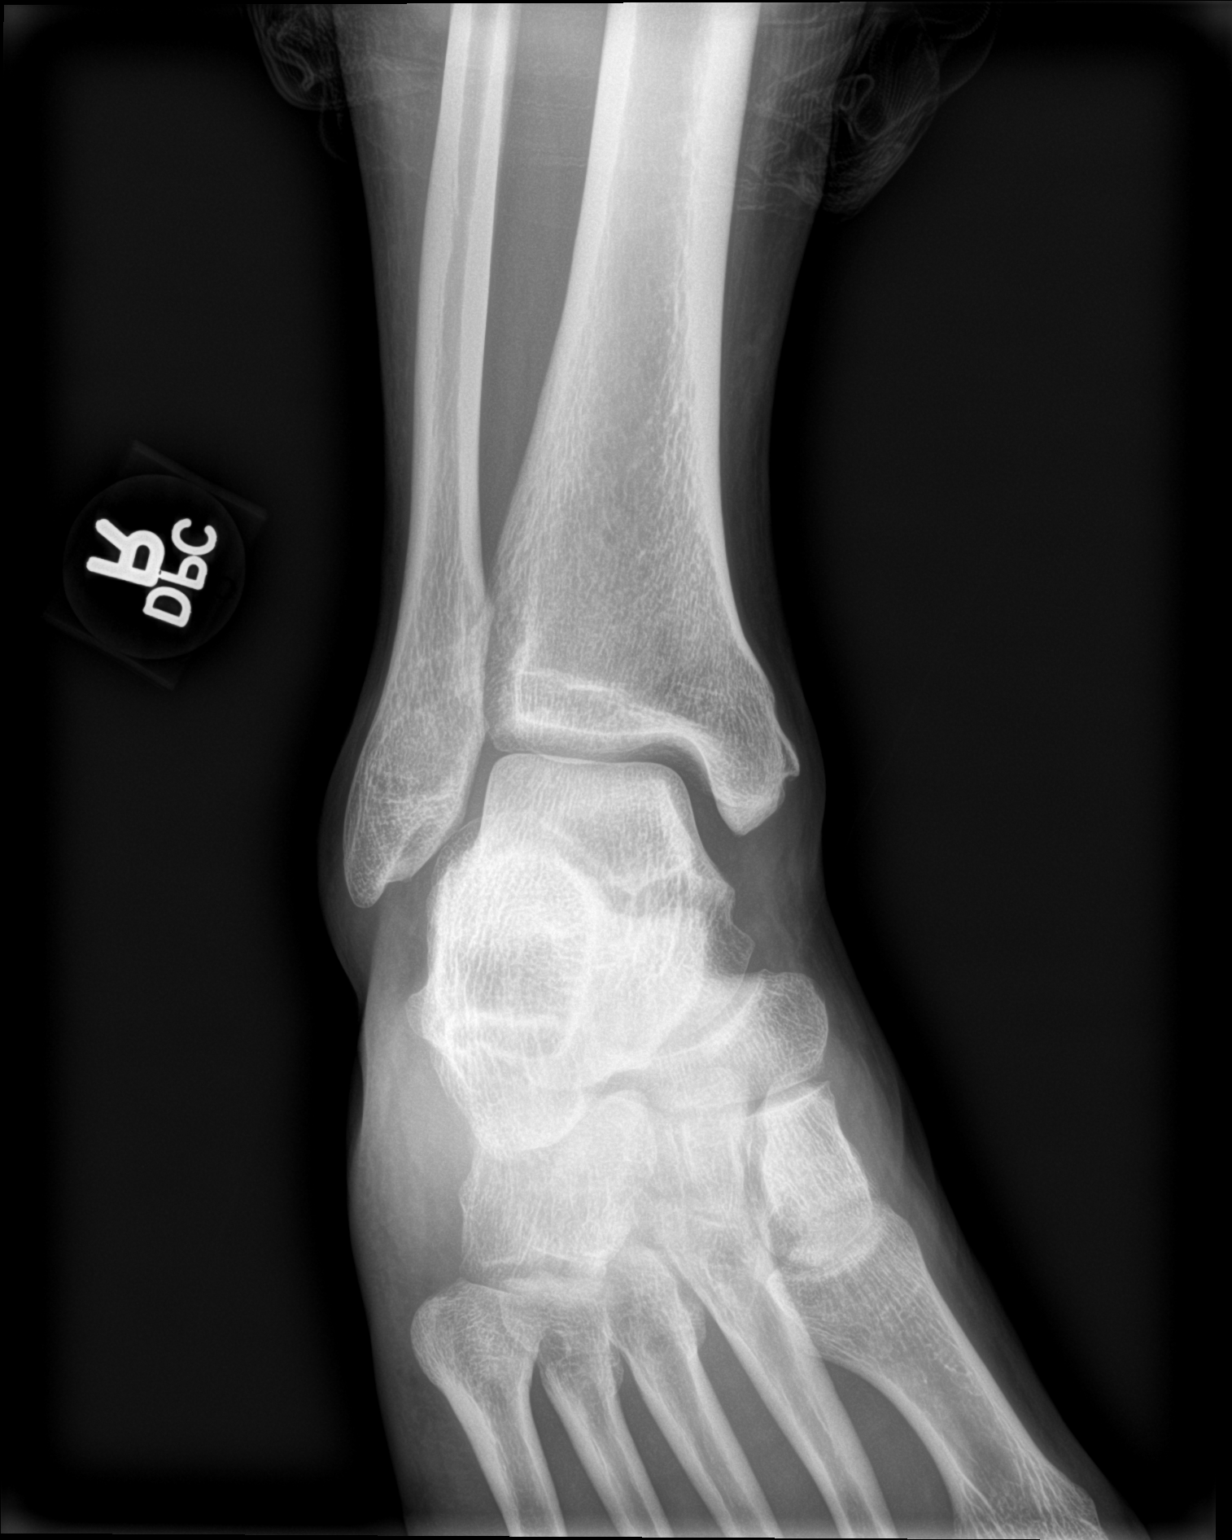

[ankle lat]
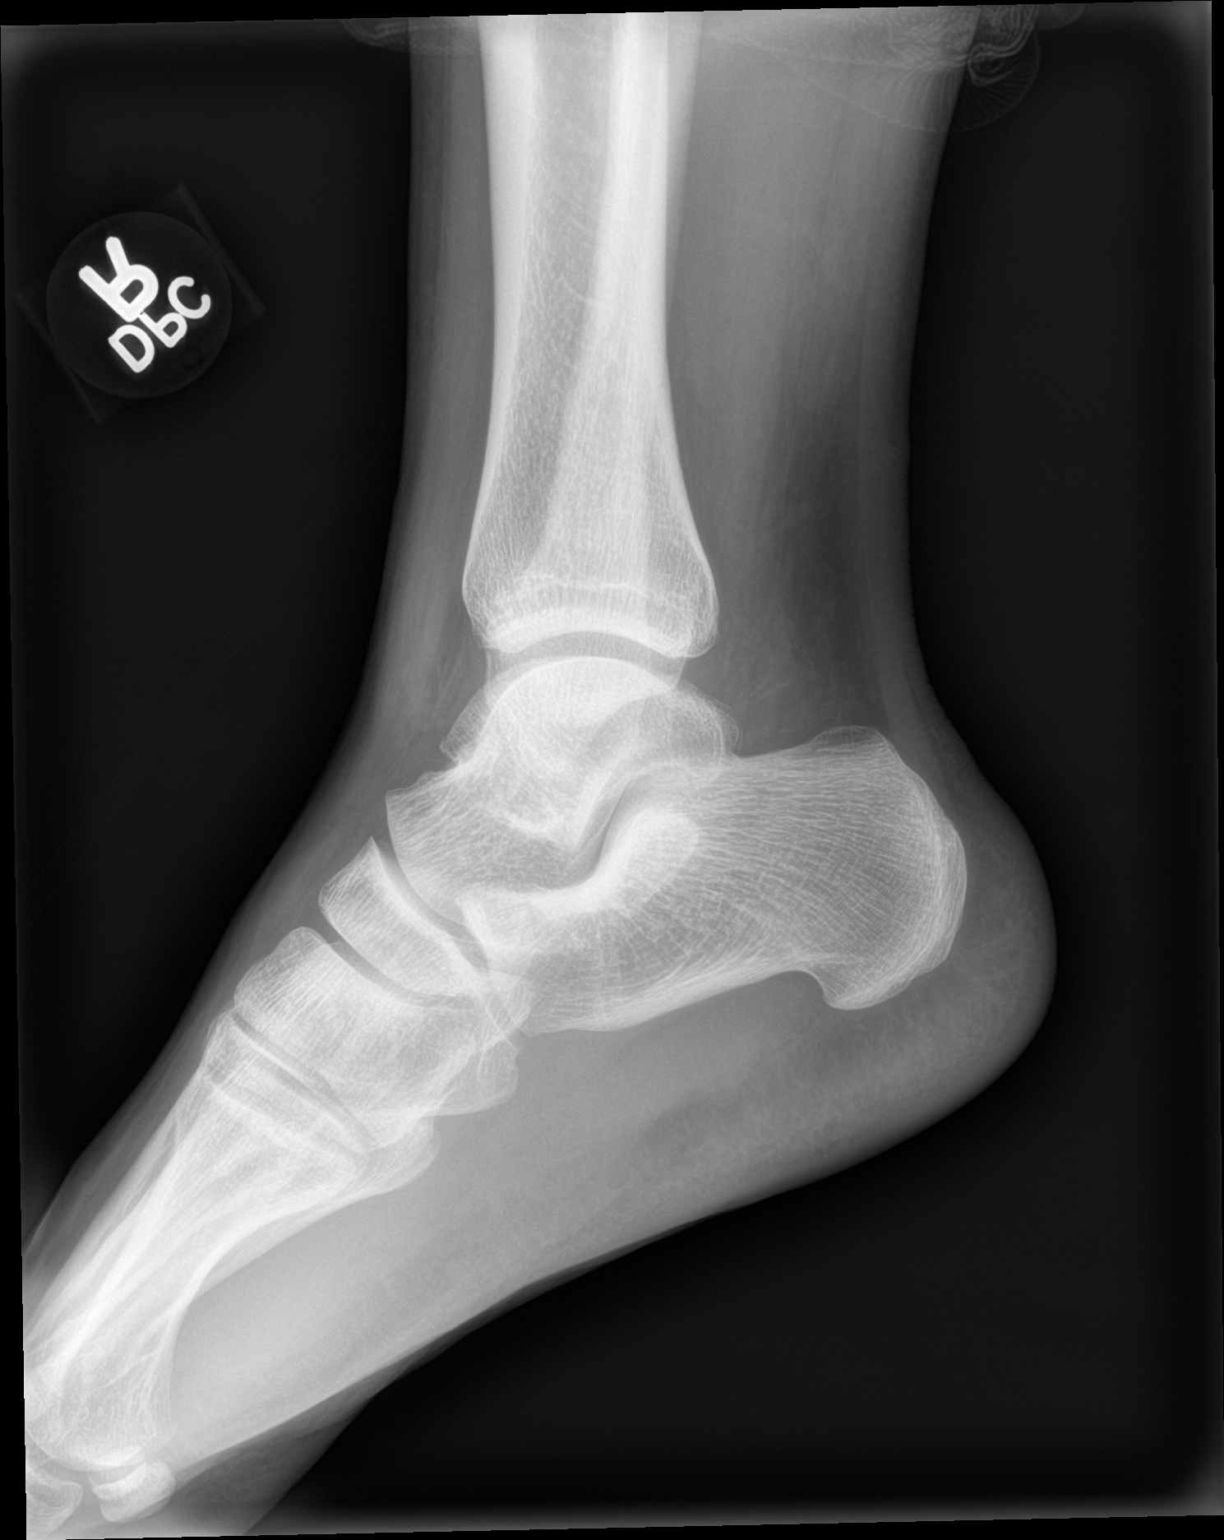

[3 of 3 positions shown; findings below may reference images not displayed]

FINDINGS: No acute displaced fracture or malalignment. Ankle mortise is
symmetric. Mild soft tissue swelling
IMPRESSION: No acute osseous abnormality

## 2023-08-07 ENCOUNTER — Emergency Department (HOSPITAL_COMMUNITY)
Admission: EM | Admit: 2023-08-07 | Discharge: 2023-08-07 | Disposition: A | Discharge status: Home / Self Care | Payer: BC Managed Care – PPO | Attending: Emergency Medicine | Admitting: Emergency Medicine

## 2023-08-07 ENCOUNTER — Other Ambulatory Visit: Payer: Self-pay

## 2023-08-07 DIAGNOSIS — I10 Essential (primary) hypertension: Secondary | ICD-10-CM | POA: Insufficient documentation

## 2023-08-07 DIAGNOSIS — U071 COVID-19: Secondary | ICD-10-CM | POA: Insufficient documentation

## 2023-08-07 LAB — SARS CORONAVIRUS 2 BY RT PCR: SARS Coronavirus 2 by RT PCR: POSITIVE — AB

## 2023-08-07 MED ORDER — ACETAMINOPHEN 325 MG PO TABS
650.0000 mg | ORAL_TABLET | Freq: Once | ORAL | Status: AC | PRN
  Administered 2023-08-07: 650 mg via ORAL
  Filled 2023-08-07: qty 2

## 2023-08-07 MED ORDER — IBUPROFEN 400 MG PO TABS
600.0000 mg | ORAL_TABLET | Freq: Once | ORAL | Status: AC
  Administered 2023-08-07: 600 mg via ORAL
  Filled 2023-08-07: qty 2

## 2023-08-07 NOTE — ED Triage Notes (Signed)
Pt complains of COVID symptoms, chills, fever, headache started today

## 2023-08-07 NOTE — Discharge Instructions (Signed)
You may use ibuprofen and/or Tylenol to help with fever, body aches, etc.  If you develop shortness of breath, chest pain, vomiting, or any other new/concerning symptoms then return to the ER or call 911.  Make sure you take your blood pressure medicines as prescribed and follow-up with your primary care physician.

## 2023-08-07 NOTE — ED Provider Notes (Signed)
[place] EMERGENCY DEPARTMENT AT [facility] Provider Note   CSN: [id number] Arrival date & time: 08/07/23  1814     History  Chief Complaint  Patient presents with   Chills   Headache    Thomas Heath is a 32 y.o. male.  HPI 32 year old male with concern for COVID.  Patient started having symptoms this morning when he got home from work.  He tested himself for COVID and it was positive but he wanted to come here to make sure.  He has had headache, fever, chills, cough and bodyaches.  No shortness of breath, chest pain or vomiting.  He has a history of hypertension but did not take his meds today because he was not feeling well.  Home Medications Prior to Admission medications   Medication Sig Start Date End Date Taking? Authorizing Provider  ibuprofen (ADVIL) 200 MG tablet Take 800 mg by mouth daily as needed for mild pain or moderate pain.    [provider]  naproxen (NAPROSYN) 500 MG tablet Take 1 tablet (500 mg total) by mouth 2 (two) times daily. 02/24/20   Petrucelli, Samantha R, PA-C      Allergies    Ace inhibitors    Review of Systems   Review of Systems  Constitutional:  Positive for chills and fever.  Respiratory:  Positive for cough. Negative for shortness of breath.   Cardiovascular:  Negative for chest pain.  Musculoskeletal:  Positive for arthralgias.  Neurological:  Positive for headaches.    Physical Exam Updated Vital Signs BP (!) 166/109   Pulse 81   Temp (!) 100.5 F (38.1 C) (Oral)   Resp 17   Ht 5\' 6"  (1.676 m)   Wt 83.9 kg   SpO2 93%   BMI 29.86 kg/m  Physical Exam Vitals and nursing note reviewed.  Constitutional:      Appearance: He is well-developed.  HENT:     Head: Normocephalic and atraumatic.  Cardiovascular:     Rate and Rhythm: Normal rate and regular rhythm.     Heart sounds: Normal heart sounds.  Pulmonary:     Effort: Pulmonary effort is normal.     Breath sounds: Normal breath sounds. No wheezing  or rales.  Abdominal:     Palpations: Abdomen is soft.     Tenderness: There is no abdominal tenderness.  Skin:    General: Skin is warm and dry.  Neurological:     Mental Status: He is alert.     ED Results / Procedures / Treatments   Labs (all labs ordered are listed, but only abnormal results are displayed) Labs Reviewed  SARS CORONAVIRUS 2 BY RT PCR - Abnormal; Notable for the following components:      Result Value   SARS Coronavirus 2 by RT PCR POSITIVE (*)    All other components within normal limits    EKG None  Radiology No results found.  Procedures Procedures    Medications Ordered in ED Medications  ibuprofen (ADVIL) tablet 600 mg (has no administration in time range)  acetaminophen (TYLENOL) tablet 650 mg (650 mg Oral Given 08/07/23 1914)    ED Course/ Medical Decision Making/ A&P                                 Medical Decision Making Risk OTC drugs. Prescription drug management.   Patient is well-appearing.  His COVID test is  positive.  He is mildly hypertensive but otherwise was encouraged to take his blood pressure meds when he gets home.  He was given some ibuprofen for symptom control.  At this point he is otherwise well-appearing and I have low suspicion for pneumonia.  His O2 sats are okay.  Will discharge home with expectant management.  Given return precautions.        Final Clinical Impression(s) / ED Diagnoses Final diagnoses:  COVID-19    Rx / DC Orders ED Discharge Orders     None         Pricilla Loveless, MD 08/07/23 2339
